# Patient Record
Sex: Female | Born: 1965 | Race: Black or African American | Hispanic: No | Marital: Single | Smoking: Never smoker
Health system: Southern US, Community
[De-identification: ages and names within clinical notes are randomized; demographics above are authoritative.]

## PROBLEM LIST (undated history)

## (undated) DIAGNOSIS — K219 Gastro-esophageal reflux disease without esophagitis: Secondary | ICD-10-CM

## (undated) DIAGNOSIS — IMO0001 Reserved for inherently not codable concepts without codable children: Secondary | ICD-10-CM

## (undated) DIAGNOSIS — M170 Bilateral primary osteoarthritis of knee: Secondary | ICD-10-CM

## (undated) DIAGNOSIS — J302 Other seasonal allergic rhinitis: Secondary | ICD-10-CM

## (undated) DIAGNOSIS — I10 Essential (primary) hypertension: Secondary | ICD-10-CM

## (undated) DIAGNOSIS — G8929 Other chronic pain: Secondary | ICD-10-CM

## (undated) DIAGNOSIS — G43909 Migraine, unspecified, not intractable, without status migrainosus: Secondary | ICD-10-CM

## (undated) DIAGNOSIS — M549 Dorsalgia, unspecified: Secondary | ICD-10-CM

## (undated) DIAGNOSIS — K8020 Calculus of gallbladder without cholecystitis without obstruction: Secondary | ICD-10-CM

## (undated) DIAGNOSIS — J45909 Unspecified asthma, uncomplicated: Secondary | ICD-10-CM

## (undated) HISTORY — DX: Morbid (severe) obesity due to excess calories: E66.01

## (undated) HISTORY — DX: Migraine, unspecified, not intractable, without status migrainosus: G43.909

## (undated) HISTORY — PX: BACK SURGERY: SHX140

## (undated) HISTORY — DX: Calculus of gallbladder without cholecystitis without obstruction: K80.20

## (undated) HISTORY — DX: Unspecified asthma, uncomplicated: J45.909

## (undated) HISTORY — DX: Other seasonal allergic rhinitis: J30.2

## (undated) HISTORY — PX: PARTIAL HYSTERECTOMY: SHX80

## (undated) HISTORY — DX: Other chronic pain: G89.29

## (undated) HISTORY — DX: Dorsalgia, unspecified: M54.9

## (undated) HISTORY — DX: Essential (primary) hypertension: I10

## (undated) HISTORY — DX: Reserved for inherently not codable concepts without codable children: IMO0001

## (undated) HISTORY — PX: TUBAL LIGATION: SHX77

## (undated) HISTORY — PX: OTHER SURGICAL HISTORY: SHX169

## (undated) HISTORY — PX: CHOLECYSTECTOMY: SHX55

## (undated) HISTORY — DX: Bilateral primary osteoarthritis of knee: M17.0

## (undated) HISTORY — DX: Gastro-esophageal reflux disease without esophagitis: K21.9

---

## 2001-05-31 ENCOUNTER — Ambulatory Visit (HOSPITAL_COMMUNITY): Admission: RE | Admit: 2001-05-31 | Discharge: 2001-05-31 | Payer: Self-pay | Admitting: Internal Medicine

## 2001-05-31 ENCOUNTER — Encounter: Payer: Self-pay | Admitting: Internal Medicine

## 2005-06-28 ENCOUNTER — Inpatient Hospital Stay (HOSPITAL_COMMUNITY): Admission: RE | Admit: 2005-06-28 | Discharge: 2005-07-01 | Payer: Self-pay | Admitting: Neurosurgery

## 2015-09-09 DIAGNOSIS — M79602 Pain in left arm: Secondary | ICD-10-CM | POA: Diagnosis not present

## 2015-09-20 DIAGNOSIS — G43009 Migraine without aura, not intractable, without status migrainosus: Secondary | ICD-10-CM | POA: Diagnosis not present

## 2015-09-20 DIAGNOSIS — M5126 Other intervertebral disc displacement, lumbar region: Secondary | ICD-10-CM | POA: Diagnosis not present

## 2015-09-20 DIAGNOSIS — M5416 Radiculopathy, lumbar region: Secondary | ICD-10-CM | POA: Diagnosis not present

## 2015-09-20 DIAGNOSIS — M5441 Lumbago with sciatica, right side: Secondary | ICD-10-CM | POA: Diagnosis not present

## 2015-09-20 DIAGNOSIS — M5442 Lumbago with sciatica, left side: Secondary | ICD-10-CM | POA: Diagnosis not present

## 2015-10-26 DIAGNOSIS — R1011 Right upper quadrant pain: Secondary | ICD-10-CM | POA: Diagnosis not present

## 2015-10-26 DIAGNOSIS — G43909 Migraine, unspecified, not intractable, without status migrainosus: Secondary | ICD-10-CM | POA: Diagnosis not present

## 2015-10-26 DIAGNOSIS — Z91013 Allergy to seafood: Secondary | ICD-10-CM | POA: Diagnosis not present

## 2015-10-26 DIAGNOSIS — R079 Chest pain, unspecified: Secondary | ICD-10-CM | POA: Diagnosis not present

## 2015-10-26 DIAGNOSIS — I161 Hypertensive emergency: Secondary | ICD-10-CM | POA: Diagnosis not present

## 2015-10-26 DIAGNOSIS — I7102 Dissection of abdominal aorta: Secondary | ICD-10-CM | POA: Diagnosis not present

## 2015-10-26 DIAGNOSIS — K920 Hematemesis: Secondary | ICD-10-CM | POA: Diagnosis not present

## 2015-10-26 DIAGNOSIS — E669 Obesity, unspecified: Secondary | ICD-10-CM | POA: Diagnosis not present

## 2015-10-26 DIAGNOSIS — I7100 Dissection of unspecified site of aorta: Secondary | ICD-10-CM | POA: Diagnosis not present

## 2015-10-26 DIAGNOSIS — I10 Essential (primary) hypertension: Secondary | ICD-10-CM | POA: Diagnosis not present

## 2015-10-26 DIAGNOSIS — F329 Major depressive disorder, single episode, unspecified: Secondary | ICD-10-CM | POA: Diagnosis not present

## 2015-10-26 DIAGNOSIS — R29898 Other symptoms and signs involving the musculoskeletal system: Secondary | ICD-10-CM | POA: Diagnosis not present

## 2015-10-26 DIAGNOSIS — Z6841 Body Mass Index (BMI) 40.0 and over, adult: Secondary | ICD-10-CM | POA: Diagnosis not present

## 2015-10-26 DIAGNOSIS — M549 Dorsalgia, unspecified: Secondary | ICD-10-CM | POA: Diagnosis not present

## 2015-10-26 DIAGNOSIS — Z880 Allergy status to penicillin: Secondary | ICD-10-CM | POA: Diagnosis not present

## 2015-10-26 DIAGNOSIS — F419 Anxiety disorder, unspecified: Secondary | ICD-10-CM | POA: Diagnosis not present

## 2015-10-26 DIAGNOSIS — Z881 Allergy status to other antibiotic agents status: Secondary | ICD-10-CM | POA: Diagnosis not present

## 2015-10-26 DIAGNOSIS — K8020 Calculus of gallbladder without cholecystitis without obstruction: Secondary | ICD-10-CM | POA: Diagnosis not present

## 2015-10-26 DIAGNOSIS — Z9114 Patient's other noncompliance with medication regimen: Secondary | ICD-10-CM | POA: Diagnosis not present

## 2015-10-26 DIAGNOSIS — R740 Nonspecific elevation of levels of transaminase and lactic acid dehydrogenase [LDH]: Secondary | ICD-10-CM | POA: Diagnosis not present

## 2015-10-26 DIAGNOSIS — R918 Other nonspecific abnormal finding of lung field: Secondary | ICD-10-CM | POA: Diagnosis not present

## 2015-10-26 DIAGNOSIS — I959 Hypotension, unspecified: Secondary | ICD-10-CM | POA: Diagnosis not present

## 2015-10-26 DIAGNOSIS — I7101 Dissection of thoracic aorta: Secondary | ICD-10-CM | POA: Diagnosis not present

## 2015-10-26 DIAGNOSIS — G4733 Obstructive sleep apnea (adult) (pediatric): Secondary | ICD-10-CM | POA: Diagnosis not present

## 2015-10-26 DIAGNOSIS — Z9884 Bariatric surgery status: Secondary | ICD-10-CM | POA: Diagnosis not present

## 2015-10-26 DIAGNOSIS — Z90710 Acquired absence of both cervix and uterus: Secondary | ICD-10-CM | POA: Diagnosis not present

## 2015-10-27 DIAGNOSIS — I959 Hypotension, unspecified: Secondary | ICD-10-CM | POA: Diagnosis not present

## 2015-10-27 DIAGNOSIS — R1011 Right upper quadrant pain: Secondary | ICD-10-CM | POA: Diagnosis not present

## 2015-10-27 DIAGNOSIS — R0789 Other chest pain: Secondary | ICD-10-CM | POA: Diagnosis not present

## 2015-10-27 DIAGNOSIS — I7101 Dissection of thoracic aorta: Secondary | ICD-10-CM | POA: Diagnosis not present

## 2015-10-27 DIAGNOSIS — K920 Hematemesis: Secondary | ICD-10-CM | POA: Diagnosis not present

## 2015-10-27 DIAGNOSIS — K8020 Calculus of gallbladder without cholecystitis without obstruction: Secondary | ICD-10-CM | POA: Diagnosis not present

## 2015-10-28 DIAGNOSIS — I7101 Dissection of thoracic aorta: Secondary | ICD-10-CM | POA: Diagnosis not present

## 2015-11-09 DIAGNOSIS — J45909 Unspecified asthma, uncomplicated: Secondary | ICD-10-CM | POA: Diagnosis not present

## 2015-11-09 DIAGNOSIS — Z1389 Encounter for screening for other disorder: Secondary | ICD-10-CM | POA: Diagnosis not present

## 2015-11-09 DIAGNOSIS — Z79899 Other long term (current) drug therapy: Secondary | ICD-10-CM | POA: Diagnosis not present

## 2015-11-09 DIAGNOSIS — G4700 Insomnia, unspecified: Secondary | ICD-10-CM | POA: Diagnosis not present

## 2015-12-06 DIAGNOSIS — G4733 Obstructive sleep apnea (adult) (pediatric): Secondary | ICD-10-CM | POA: Diagnosis not present

## 2015-12-10 DIAGNOSIS — N289 Disorder of kidney and ureter, unspecified: Secondary | ICD-10-CM | POA: Diagnosis not present

## 2015-12-10 DIAGNOSIS — G479 Sleep disorder, unspecified: Secondary | ICD-10-CM | POA: Diagnosis not present

## 2015-12-10 DIAGNOSIS — I10 Essential (primary) hypertension: Secondary | ICD-10-CM | POA: Diagnosis not present

## 2016-01-12 DIAGNOSIS — G43009 Migraine without aura, not intractable, without status migrainosus: Secondary | ICD-10-CM | POA: Diagnosis not present

## 2016-01-12 DIAGNOSIS — M5441 Lumbago with sciatica, right side: Secondary | ICD-10-CM | POA: Diagnosis not present

## 2016-01-12 DIAGNOSIS — M5126 Other intervertebral disc displacement, lumbar region: Secondary | ICD-10-CM | POA: Diagnosis not present

## 2016-01-12 DIAGNOSIS — M5416 Radiculopathy, lumbar region: Secondary | ICD-10-CM | POA: Diagnosis not present

## 2016-01-12 DIAGNOSIS — M5442 Lumbago with sciatica, left side: Secondary | ICD-10-CM | POA: Diagnosis not present

## 2016-01-30 DIAGNOSIS — R1011 Right upper quadrant pain: Secondary | ICD-10-CM | POA: Diagnosis not present

## 2016-01-30 DIAGNOSIS — R079 Chest pain, unspecified: Secondary | ICD-10-CM | POA: Diagnosis not present

## 2016-01-31 DIAGNOSIS — M5441 Lumbago with sciatica, right side: Secondary | ICD-10-CM | POA: Diagnosis not present

## 2016-01-31 DIAGNOSIS — M5442 Lumbago with sciatica, left side: Secondary | ICD-10-CM | POA: Diagnosis not present

## 2016-01-31 DIAGNOSIS — G8929 Other chronic pain: Secondary | ICD-10-CM | POA: Diagnosis not present

## 2016-02-03 DIAGNOSIS — Z8679 Personal history of other diseases of the circulatory system: Secondary | ICD-10-CM | POA: Diagnosis not present

## 2016-02-03 DIAGNOSIS — Z9884 Bariatric surgery status: Secondary | ICD-10-CM | POA: Diagnosis not present

## 2016-02-03 DIAGNOSIS — G479 Sleep disorder, unspecified: Secondary | ICD-10-CM | POA: Diagnosis not present

## 2016-02-03 DIAGNOSIS — K828 Other specified diseases of gallbladder: Secondary | ICD-10-CM | POA: Diagnosis not present

## 2016-02-07 DIAGNOSIS — K8080 Other cholelithiasis without obstruction: Secondary | ICD-10-CM | POA: Diagnosis not present

## 2016-02-07 DIAGNOSIS — K8020 Calculus of gallbladder without cholecystitis without obstruction: Secondary | ICD-10-CM | POA: Diagnosis not present

## 2016-02-09 DIAGNOSIS — K8020 Calculus of gallbladder without cholecystitis without obstruction: Secondary | ICD-10-CM | POA: Diagnosis not present

## 2016-02-09 DIAGNOSIS — R945 Abnormal results of liver function studies: Secondary | ICD-10-CM | POA: Diagnosis not present

## 2016-02-15 ENCOUNTER — Institutional Professional Consult (permissible substitution) (INDEPENDENT_AMBULATORY_CARE_PROVIDER_SITE_OTHER): Payer: Medicare Other | Admitting: Thoracic Surgery (Cardiothoracic Vascular Surgery)

## 2016-02-15 ENCOUNTER — Encounter: Payer: Self-pay | Admitting: Thoracic Surgery (Cardiothoracic Vascular Surgery)

## 2016-02-15 ENCOUNTER — Encounter: Payer: Self-pay | Admitting: *Deleted

## 2016-02-15 VITALS — BP 158/90 | HR 70 | Resp 16 | Ht 62.0 in | Wt 265.0 lb

## 2016-02-15 DIAGNOSIS — Z8679 Personal history of other diseases of the circulatory system: Secondary | ICD-10-CM

## 2016-02-15 NOTE — Progress Notes (Signed)
PCP is REDDING Valrie Hart., MD Referring Provider is Street, Stephanie Coup, *  Chief Complaint  Patient presents with  . Referral    to R/O AORTIC DISSECTION....PREPARING FOR CHOLECYSTECTOMY    HPI: [name] is sent for consultation regarding a possible aortic dissection.  She is a 50 year old woman with a history of morbid obesity status post bariatric surgery, asthma, hypertension, and chronic back pain. She was seen in the emergency room at [facility] back in March of this year for chest pain. I do not have access to those CT scan images, but the official report states "there is a thin subtle flap in the lumen of the aorta from the aortic root extending 13 mm into the ascending aorta. Anterior descending, circumflex, and right coronary arteries appear to enhance area no further abnormalities in the aorta or the origins of the great vessels of the neck." She was transferred to the [facility] but was told that she probably did not have an aortic dissection.  She recently was in the emergency department again with chest pain. This was right upper quadrant and epigastric in nature. A CT angio of the chest, abdomen and pelvis was done. It showed "mild cardiomegaly. No definite dissection of the thoracic aorta. No evidence of aneurysm. Normal 3 vessel takeoff from the aortic arch. Pulmonary arterial system is within normal. Minimal low density adjacent to the aortic arch unchanged." Her pain was felt to be due to biliary colic and she was told she needed her gallbladder out. She has not had further pain since that visit.   Past Medical History  Diagnosis Date  . Chronic back pain     f/b [facility]  . Asthma     f/b Dr. Lucie Leather  . Morbid obesity (HCC)   . Migraine   . Reflux   . Cholelithiasis without cholecystitis   . Seasonal allergies     Past Surgical History  Procedure Laterality Date  . Tubal ligation    . Back surgery      low disc   . Gastrectomy sleeve       bariatric    Family History  Problem Relation Age of Onset  . Diabetes Mother   . Heart disease Father   . Cancer Sister     bone    Social History Social History  Substance Use Topics  . Smoking status: Never Smoker   . Smokeless tobacco: None  . Alcohol Use: No    Current Outpatient Prescriptions  Medication Sig Dispense Refill  . metoprolol succinate (TOPROL-XL) 50 MG 24 hr tablet Take 50 mg by mouth daily. Take with or immediately following a meal.    . albuterol (PROVENTIL HFA;VENTOLIN HFA) 108 (90 Base) MCG/ACT inhaler Inhale 2 puffs into the lungs 3 (three) times daily as needed for wheezing or shortness of breath.    . ALPRAZolam (XANAX) 0.25 MG tablet Take 0.25-0.5 mg by mouth at bedtime as needed for anxiety.    Marland Kitchen amLODipine (NORVASC) 5 MG tablet Take 5 mg by mouth daily.    Marland Kitchen gabapentin (NEURONTIN) 600 MG tablet Take 600 mg by mouth 3 (three) times daily.    . hydrochlorothiazide (HYDRODIURIL) 25 MG tablet Take 25 mg by mouth daily.    Marland Kitchen oxyCODONE (ROXICODONE) 15 MG immediate release tablet Take 15 mg by mouth every 4 (four) hours as needed for pain (every 4-6 hrs prn).    . traMADol (ULTRAM) 50 MG tablet Take by mouth every 6 (six) hours as  needed.    . venlafaxine XR (EFFEXOR-XR) 150 MG 24 hr capsule Take 150 mg by mouth daily with breakfast.     No current facility-administered medications for this visit.    Allergies  Allergen Reactions  . Penicillins Other (See Comments)    Severe reaction   . Contrast Media [Iodinated Diagnostic Agents]     Includes shellfish   . Vigamox [Moxifloxacin] Swelling    Some eye swelling      Review of Systems  Constitutional: Negative for fever, chills, appetite change and unexpected weight change.  HENT: Positive for dental problem. Negative for trouble swallowing and voice change.   Eyes: Negative for visual disturbance.  Respiratory: Positive for cough and shortness of breath. Negative for wheezing.    Cardiovascular: Positive for chest pain and leg swelling.  Gastrointestinal: Positive for abdominal pain and constipation.  Genitourinary: Negative for dysuria and difficulty urinating.  Musculoskeletal: Positive for myalgias and arthralgias.       Pain and feet with lying flat. Poor circulation in legs.  Neurological: Positive for numbness and headaches.  Hematological: Negative for adenopathy. Bruises/bleeds easily.  All other systems reviewed and are negative.   BP 158/90 mmHg  Pulse 70  Resp 16  Ht 5\' 2"  (1.575 m)  Wt 265 lb (120.203 kg)  BMI 48.46 kg/m2  SpO2 98% Physical Exam  Constitutional: She is oriented to person, place, and time.  Morbidly obese  HENT:  Head: Normocephalic and atraumatic.  Mouth/Throat: No oropharyngeal exudate.  Eyes: Conjunctivae and EOM are normal. No scleral icterus.  Neck: Neck supple. No thyromegaly present.  No carotid bruits  Cardiovascular: Normal rate, regular rhythm and normal heart sounds.  Exam reveals no gallop and no friction rub.   No murmur heard. Pulmonary/Chest: Effort normal. No respiratory distress. She has no wheezes. She has no rales.  Diminished breath sounds bilaterally  Abdominal: Soft. She exhibits no distension. There is no tenderness.  Musculoskeletal: She exhibits no edema.  Lymphadenopathy:    She has no cervical adenopathy.  Neurological: She is alert and oriented to person, place, and time. No cranial nerve deficit.  No focal motor deficit  Skin: Skin is warm and dry.  Vitals reviewed.    Diagnostic Tests: I personally reviewed the CT chest from 01/30/2016. There is no clear evidence of aortic dissection. There is likely motion artifact. The area is not well visualized and the possibility of a very small limited dissection cannot be ruled out.  Impression: 50 year old woman who is sent for consultation regarding a possible ascending aortic dissection. She had presented to the emergency room back in March with  chest pain. A CT angiogram showed a possible very limited aortic dissection in the root. She was transferred to [facility]. I do not have records from that visit, but she says she was told that she did not have a dissection and did not need surgery.  She recently was back in the emergency room with chest pain once again. It turns out this is likely due to biliary colic and she needs her gallbladder removed. Another CT was done. The official radiology reading was that there was no definite evidence of an aortic dissection.  I reviewed her CT images from 01/30/2016. I cannot definitively rule out the possibility of there is a very limited dissection but certainly there is no major dissection. There is likely motion artifact. I do not have the films from March, but is possible that motion artifact with a count for the  findings on that CT angiogram in that particular area, but I cannot give a definite opinion on that without reviewing the films.  In any event, there is no evidence of an acute aortic dissection. There is no indication for surgical intervention. I don't think there is any indication for additional workup at this time. There is no contraindication to proceeding with gallbladder surgery from the standpoint of her ascending aorta.  Her blood pressure was elevated today. That is understandable in the setting of seeing a surgeon for the first time. Her blood pressure was 130/80 at her most recent visit with primary care. That is likely more indicative of her normal blood pressure. Blood pressure control is important for her.  Plan:  I would like to see her back in 6 months with a repeat CT angiogram of the chest to look at the ascending aorta one more time to see if there is any evidence of aortic dissection. If there is truly a chronic dissection she would need continued follow-up for possible late aneurysmal dilatation.  Loreli SlotSteven C Shanaia Sievers, MD [facility] [phone]  [phone]

## 2016-02-23 DIAGNOSIS — K644 Residual hemorrhoidal skin tags: Secondary | ICD-10-CM | POA: Diagnosis not present

## 2016-02-29 DIAGNOSIS — R079 Chest pain, unspecified: Secondary | ICD-10-CM | POA: Diagnosis not present

## 2016-02-29 DIAGNOSIS — G8929 Other chronic pain: Secondary | ICD-10-CM | POA: Diagnosis not present

## 2016-02-29 DIAGNOSIS — M1990 Unspecified osteoarthritis, unspecified site: Secondary | ICD-10-CM | POA: Diagnosis not present

## 2016-02-29 DIAGNOSIS — K9171 Accidental puncture and laceration of a digestive system organ or structure during a digestive system procedure: Secondary | ICD-10-CM | POA: Diagnosis not present

## 2016-02-29 DIAGNOSIS — K8080 Other cholelithiasis without obstruction: Secondary | ICD-10-CM | POA: Diagnosis not present

## 2016-02-29 DIAGNOSIS — K660 Peritoneal adhesions (postprocedural) (postinfection): Secondary | ICD-10-CM | POA: Diagnosis not present

## 2016-02-29 DIAGNOSIS — Z9884 Bariatric surgery status: Secondary | ICD-10-CM | POA: Diagnosis not present

## 2016-02-29 DIAGNOSIS — S36439A Laceration of unspecified part of small intestine, initial encounter: Secondary | ICD-10-CM | POA: Diagnosis not present

## 2016-02-29 DIAGNOSIS — R0789 Other chest pain: Secondary | ICD-10-CM | POA: Diagnosis not present

## 2016-02-29 DIAGNOSIS — Z8249 Family history of ischemic heart disease and other diseases of the circulatory system: Secondary | ICD-10-CM | POA: Diagnosis not present

## 2016-02-29 DIAGNOSIS — Z9049 Acquired absence of other specified parts of digestive tract: Secondary | ICD-10-CM | POA: Diagnosis not present

## 2016-02-29 DIAGNOSIS — Z9889 Other specified postprocedural states: Secondary | ICD-10-CM | POA: Diagnosis not present

## 2016-02-29 DIAGNOSIS — R7989 Other specified abnormal findings of blood chemistry: Secondary | ICD-10-CM | POA: Diagnosis not present

## 2016-02-29 DIAGNOSIS — R932 Abnormal findings on diagnostic imaging of liver and biliary tract: Secondary | ICD-10-CM | POA: Diagnosis not present

## 2016-02-29 DIAGNOSIS — I10 Essential (primary) hypertension: Secondary | ICD-10-CM | POA: Diagnosis not present

## 2016-02-29 DIAGNOSIS — K8020 Calculus of gallbladder without cholecystitis without obstruction: Secondary | ICD-10-CM | POA: Diagnosis not present

## 2016-02-29 DIAGNOSIS — K824 Cholesterolosis of gallbladder: Secondary | ICD-10-CM | POA: Diagnosis not present

## 2016-02-29 DIAGNOSIS — K449 Diaphragmatic hernia without obstruction or gangrene: Secondary | ICD-10-CM | POA: Diagnosis not present

## 2016-02-29 DIAGNOSIS — R188 Other ascites: Secondary | ICD-10-CM | POA: Diagnosis not present

## 2016-02-29 DIAGNOSIS — Z880 Allergy status to penicillin: Secondary | ICD-10-CM | POA: Diagnosis not present

## 2016-02-29 DIAGNOSIS — M549 Dorsalgia, unspecified: Secondary | ICD-10-CM | POA: Diagnosis not present

## 2016-02-29 DIAGNOSIS — Z79891 Long term (current) use of opiate analgesic: Secondary | ICD-10-CM | POA: Diagnosis not present

## 2016-02-29 DIAGNOSIS — K219 Gastro-esophageal reflux disease without esophagitis: Secondary | ICD-10-CM | POA: Diagnosis not present

## 2016-02-29 DIAGNOSIS — R918 Other nonspecific abnormal finding of lung field: Secondary | ICD-10-CM | POA: Diagnosis not present

## 2016-03-30 DIAGNOSIS — G4700 Insomnia, unspecified: Secondary | ICD-10-CM | POA: Diagnosis not present

## 2016-03-30 DIAGNOSIS — M722 Plantar fascial fibromatosis: Secondary | ICD-10-CM | POA: Diagnosis not present

## 2016-04-05 DIAGNOSIS — R200 Anesthesia of skin: Secondary | ICD-10-CM | POA: Diagnosis not present

## 2016-04-05 DIAGNOSIS — M5416 Radiculopathy, lumbar region: Secondary | ICD-10-CM | POA: Diagnosis not present

## 2016-04-05 DIAGNOSIS — G43009 Migraine without aura, not intractable, without status migrainosus: Secondary | ICD-10-CM | POA: Diagnosis not present

## 2016-04-05 DIAGNOSIS — M5126 Other intervertebral disc displacement, lumbar region: Secondary | ICD-10-CM | POA: Diagnosis not present

## 2016-04-05 DIAGNOSIS — M5441 Lumbago with sciatica, right side: Secondary | ICD-10-CM | POA: Diagnosis not present

## 2016-04-11 DIAGNOSIS — M722 Plantar fascial fibromatosis: Secondary | ICD-10-CM | POA: Diagnosis not present

## 2016-04-11 DIAGNOSIS — M6702 Short Achilles tendon (acquired), left ankle: Secondary | ICD-10-CM | POA: Diagnosis not present

## 2016-05-15 DIAGNOSIS — Z1231 Encounter for screening mammogram for malignant neoplasm of breast: Secondary | ICD-10-CM | POA: Diagnosis not present

## 2016-06-21 DIAGNOSIS — Z23 Encounter for immunization: Secondary | ICD-10-CM | POA: Diagnosis not present

## 2016-06-21 DIAGNOSIS — G4700 Insomnia, unspecified: Secondary | ICD-10-CM | POA: Diagnosis not present

## 2016-06-21 DIAGNOSIS — I10 Essential (primary) hypertension: Secondary | ICD-10-CM | POA: Diagnosis not present

## 2016-07-05 DIAGNOSIS — M5416 Radiculopathy, lumbar region: Secondary | ICD-10-CM | POA: Diagnosis not present

## 2016-07-05 DIAGNOSIS — G8929 Other chronic pain: Secondary | ICD-10-CM | POA: Diagnosis not present

## 2016-07-05 DIAGNOSIS — G43009 Migraine without aura, not intractable, without status migrainosus: Secondary | ICD-10-CM | POA: Diagnosis not present

## 2016-07-05 DIAGNOSIS — M5441 Lumbago with sciatica, right side: Secondary | ICD-10-CM | POA: Diagnosis not present

## 2016-07-05 DIAGNOSIS — M5126 Other intervertebral disc displacement, lumbar region: Secondary | ICD-10-CM | POA: Diagnosis not present

## 2016-07-13 DIAGNOSIS — J00 Acute nasopharyngitis [common cold]: Secondary | ICD-10-CM | POA: Diagnosis not present

## 2016-07-27 ENCOUNTER — Other Ambulatory Visit: Payer: Self-pay | Admitting: Thoracic Surgery (Cardiothoracic Vascular Surgery)

## 2016-07-27 DIAGNOSIS — I7121 Aneurysm of the ascending aorta, without rupture: Secondary | ICD-10-CM

## 2016-07-27 DIAGNOSIS — I712 Thoracic aortic aneurysm, without rupture: Secondary | ICD-10-CM

## 2016-08-21 ENCOUNTER — Other Ambulatory Visit: Payer: Self-pay | Admitting: Thoracic Surgery (Cardiothoracic Vascular Surgery)

## 2016-08-22 ENCOUNTER — Ambulatory Visit: Payer: Medicare Other | Admitting: Thoracic Surgery (Cardiothoracic Vascular Surgery)

## 2016-08-22 ENCOUNTER — Other Ambulatory Visit: Payer: Medicare Other

## 2016-08-25 ENCOUNTER — Inpatient Hospital Stay: Admission: RE | Admit: 2016-08-25 | Payer: Medicare Other | Source: Ambulatory Visit

## 2016-08-25 DIAGNOSIS — J45901 Unspecified asthma with (acute) exacerbation: Secondary | ICD-10-CM | POA: Diagnosis not present

## 2016-08-25 DIAGNOSIS — J209 Acute bronchitis, unspecified: Secondary | ICD-10-CM | POA: Diagnosis not present

## 2016-09-05 ENCOUNTER — Ambulatory Visit: Payer: Medicare Other | Admitting: Thoracic Surgery (Cardiothoracic Vascular Surgery)

## 2016-09-19 ENCOUNTER — Encounter: Payer: Self-pay | Admitting: Thoracic Surgery (Cardiothoracic Vascular Surgery)

## 2016-09-19 ENCOUNTER — Ambulatory Visit (INDEPENDENT_AMBULATORY_CARE_PROVIDER_SITE_OTHER): Payer: Medicare Other | Admitting: Thoracic Surgery (Cardiothoracic Vascular Surgery)

## 2016-09-19 ENCOUNTER — Ambulatory Visit
Admission: RE | Admit: 2016-09-19 | Discharge: 2016-09-19 | Disposition: A | Discharge status: Home / Self Care | Payer: Medicare Other | Source: Ambulatory Visit | Attending: Thoracic Surgery (Cardiothoracic Vascular Surgery) | Admitting: Thoracic Surgery (Cardiothoracic Vascular Surgery)

## 2016-09-19 VITALS — BP 177/97 | HR 84 | Resp 20 | Ht 62.0 in | Wt 265.0 lb

## 2016-09-19 DIAGNOSIS — I10 Essential (primary) hypertension: Secondary | ICD-10-CM | POA: Diagnosis not present

## 2016-09-19 DIAGNOSIS — I712 Thoracic aortic aneurysm, without rupture: Secondary | ICD-10-CM

## 2016-09-19 DIAGNOSIS — Z8679 Personal history of other diseases of the circulatory system: Secondary | ICD-10-CM | POA: Diagnosis not present

## 2016-09-19 DIAGNOSIS — I7121 Aneurysm of the ascending aorta, without rupture: Secondary | ICD-10-CM

## 2016-09-19 MED ORDER — IOPAMIDOL (ISOVUE-370) INJECTION 76%
75.0000 mL | Freq: Once | INTRAVENOUS | Status: AC | PRN
  Administered 2016-09-19: 75 mL via INTRAVENOUS

## 2016-09-19 NOTE — Progress Notes (Signed)
[address]       [place] [id number]             [phone]    HPI: [name] returns today for a follow-up of her ascending aorta.  [name] is a 51 year old woman with a history of hypertension and morbid obesity. In March 2017 she was seen in [facility] room [facility] for chest pain. She had a CT done which showed a question of an aortic dissection in the ascending aorta. She was sent to [facility]. The doctors there told her she did not have an aortic dissection and did not need surgery. In July she had another episode of epigastric and right upper quadrant pain. A CT angios the chest abdomen and pelvis showed no definitive evidence of aortic dissection. She was found to have cholelithiasis and had a cholecystectomy. She has not had any further epigastric or chest pain since then.  Today she complains of some numbness and tingling in her right arm.   Current Outpatient Prescriptions  Medication Sig Dispense Refill  . albuterol (PROVENTIL HFA;VENTOLIN HFA) 108 (90 Base) MCG/ACT inhaler Inhale 2 puffs into the lungs 3 (three) times daily as needed for wheezing or shortness of breath.    . ALPRAZolam (XANAX) 0.25 MG tablet Take 0.25-0.5 mg by mouth at bedtime as needed for anxiety.    Marland Kitchen. amLODipine (NORVASC) 5 MG tablet Take 5 mg by mouth daily.    Marland Kitchen. gabapentin (NEURONTIN) 600 MG tablet Take 600 mg by mouth 3 (three) times daily.    . hydrochlorothiazide (HYDRODIURIL) 25 MG tablet Take 25 mg by mouth daily.    . metoprolol succinate (TOPROL-XL) 50 MG 24 hr tablet Take 50 mg by mouth daily. Take with or immediately following a meal.    . oxyCODONE (ROXICODONE) 15 MG immediate release tablet Take 15 mg by mouth every 4 (four) hours as needed for pain (every 4-6 hrs prn).    . traMADol (ULTRAM) 50 MG tablet Take by mouth every 6 (six) hours as needed.    . venlafaxine XR (EFFEXOR-XR) 150 MG 24 hr capsule Take 150 mg by mouth daily with breakfast.      No current facility-administered medications for this visit.     Physical Exam BP (!) 177/97   Pulse 84   Resp 20   Ht 5\' 2"  (1.575 m)   Wt 265 lb (120.2 kg)   SpO2 98% Comment: RA  BMI 48.47 kg/m  Morbidly obese 51 year old woman in no acute distress Alert and oriented 3 with no focal deficits Cardiac regular rate and rhythm no murmur Lungs clear with equal results bilaterally Pulses 2+ and symmetric bilaterally. Right hand well perfused with brisk capillary refill. Motor intact  Diagnostic Tests: CT ANGIOGRAPHY CHEST WITH CONTRAST  TECHNIQUE: Multidetector CT imaging of the chest was performed using the standard protocol during bolus administration of intravenous contrast. Multiplanar CT image reconstructions and MIPs were obtained to evaluate the vascular anatomy. Procedure was performed with cardiac gating.  CONTRAST:  75 mL Isovue 370  COMPARISON:  None.  FINDINGS: Cardiovascular: Aortic root measures 3.0 cm. Maximum diameter of the ascending thoracic aorta measures up to 3.6 cm and that may be a slight overestimation. Aortic arch roughly measures 3 cm in diameter. Proximal descending thoracic aorta measures 2.6 cm. Distal descending thoracic aorta measures 2.3 cm. Normal caliber of the proximal abdominal aorta. Celiac trunk and SMA are patent. Main renal arteries are patent.  Main pulmonary arteries are patent.  Mediastinum/Nodes: No gross abnormality to thyroid tissue. No significant mediastinal or hilar lymphadenopathy. No pericardial effusion.  Lungs/Pleura: Trachea and mainstem bronchi are patent. Two small nodules in the right middle lobe both of which measure 3 mm. No significant airspace disease or lung consolidation. No pleural effusions. Punctate pleural-based nodule in left upper lobe on sequence 5 image 43. Question a 4 mm nodule at the left lung apex on sequence 5, image 20. Question a 3 mm nodule in left upper lobe on image 34.  Upper  Abdomen: Patient has had gastric surgery. Question parapelvic renal cysts but poorly characterized due to the timing of the contrast administration and patient's large body habitus. No acute abnormality in the upper abdomen.  Musculoskeletal: No acute bone abnormality.  Review of the MIP images confirms the above findings.  IMPRESSION: No evidence for a thoracic aortic aneurysm or dissection.  Small punctate pulmonary nodules, largest measuring 4 mm. These are nonspecific. No follow-up needed if patient is low-risk. Non-contrast chest CT can be considered in 12 months if patient is high-risk. This recommendation follows the consensus statement: Guidelines for Management of Incidental Pulmonary Nodules Detected on CT Images: From the Fleischner Society 2017; Radiology 2017; 284:228-243.  Prior gastric surgery.  Low-density in the renal sinus regions. Findings could represent parapelvic renal cysts but incompletely evaluated on this examination. Hydronephrosis is thought to be less likely.   Electronically Signed   By: Richarda Overlie M.D.   On: 09/19/2016 16:04  I personally reviewed the CT angiogram. Agree that there is no evidence of aortic aneurysm or dissection.  Impression: 51 year old woman who at one time was told she had an aortic dissection. She now has had 2 follow-up CT angiograms with no evidence of aortic aneurysm or dissection. No further follow-up is necessary.  Hypertension- poorly controlled she should follow-up with Dr. Jeanie Sewer regarding that issue.  Small punctate pulmonary nodules measuring 4 mm or less. Nonsmoker so low risk. No need for further follow-up.  Plan: I will be happy to see [name] again at any time in the future if I can be of any further assistance with her care.  Loreli Slot, MD [facility] [phone]

## 2016-11-03 DIAGNOSIS — M5441 Lumbago with sciatica, right side: Secondary | ICD-10-CM | POA: Diagnosis not present

## 2016-11-03 DIAGNOSIS — M5416 Radiculopathy, lumbar region: Secondary | ICD-10-CM | POA: Diagnosis not present

## 2016-11-03 DIAGNOSIS — G8929 Other chronic pain: Secondary | ICD-10-CM | POA: Diagnosis not present

## 2016-11-03 DIAGNOSIS — M5126 Other intervertebral disc displacement, lumbar region: Secondary | ICD-10-CM | POA: Diagnosis not present

## 2016-11-09 DIAGNOSIS — J0100 Acute maxillary sinusitis, unspecified: Secondary | ICD-10-CM | POA: Diagnosis not present

## 2016-11-21 DIAGNOSIS — M5126 Other intervertebral disc displacement, lumbar region: Secondary | ICD-10-CM | POA: Diagnosis not present

## 2016-12-19 DIAGNOSIS — Z0000 Encounter for general adult medical examination without abnormal findings: Secondary | ICD-10-CM | POA: Diagnosis not present

## 2016-12-19 DIAGNOSIS — I10 Essential (primary) hypertension: Secondary | ICD-10-CM | POA: Diagnosis not present

## 2016-12-19 DIAGNOSIS — M5432 Sciatica, left side: Secondary | ICD-10-CM | POA: Diagnosis not present

## 2017-01-01 DIAGNOSIS — M5416 Radiculopathy, lumbar region: Secondary | ICD-10-CM | POA: Diagnosis not present

## 2017-01-01 DIAGNOSIS — G8929 Other chronic pain: Secondary | ICD-10-CM | POA: Diagnosis not present

## 2017-01-01 DIAGNOSIS — M5441 Lumbago with sciatica, right side: Secondary | ICD-10-CM | POA: Diagnosis not present

## 2017-01-01 DIAGNOSIS — G43009 Migraine without aura, not intractable, without status migrainosus: Secondary | ICD-10-CM | POA: Diagnosis not present

## 2017-01-01 DIAGNOSIS — M5126 Other intervertebral disc displacement, lumbar region: Secondary | ICD-10-CM | POA: Diagnosis not present

## 2017-02-21 DIAGNOSIS — M25562 Pain in left knee: Secondary | ICD-10-CM | POA: Diagnosis not present

## 2017-02-21 DIAGNOSIS — M545 Low back pain: Secondary | ICD-10-CM | POA: Diagnosis not present

## 2017-02-21 DIAGNOSIS — M25552 Pain in left hip: Secondary | ICD-10-CM | POA: Diagnosis not present

## 2017-02-27 DIAGNOSIS — M5416 Radiculopathy, lumbar region: Secondary | ICD-10-CM | POA: Diagnosis not present

## 2017-02-27 DIAGNOSIS — M5441 Lumbago with sciatica, right side: Secondary | ICD-10-CM | POA: Diagnosis not present

## 2017-02-27 DIAGNOSIS — G8929 Other chronic pain: Secondary | ICD-10-CM | POA: Diagnosis not present

## 2017-03-06 DIAGNOSIS — M1712 Unilateral primary osteoarthritis, left knee: Secondary | ICD-10-CM | POA: Diagnosis not present

## 2017-03-13 DIAGNOSIS — N390 Urinary tract infection, site not specified: Secondary | ICD-10-CM | POA: Diagnosis not present

## 2017-04-19 DIAGNOSIS — I10 Essential (primary) hypertension: Secondary | ICD-10-CM | POA: Diagnosis not present

## 2017-04-19 DIAGNOSIS — G8929 Other chronic pain: Secondary | ICD-10-CM | POA: Diagnosis not present

## 2017-04-19 DIAGNOSIS — Z5181 Encounter for therapeutic drug level monitoring: Secondary | ICD-10-CM | POA: Diagnosis not present

## 2017-04-19 DIAGNOSIS — M5442 Lumbago with sciatica, left side: Secondary | ICD-10-CM | POA: Diagnosis not present

## 2017-05-16 DIAGNOSIS — G43909 Migraine, unspecified, not intractable, without status migrainosus: Secondary | ICD-10-CM | POA: Diagnosis not present

## 2017-05-16 DIAGNOSIS — I160 Hypertensive urgency: Secondary | ICD-10-CM | POA: Diagnosis not present

## 2017-05-21 DIAGNOSIS — M533 Sacrococcygeal disorders, not elsewhere classified: Secondary | ICD-10-CM | POA: Diagnosis not present

## 2017-05-21 DIAGNOSIS — M7918 Myalgia, other site: Secondary | ICD-10-CM | POA: Diagnosis not present

## 2017-05-21 DIAGNOSIS — M5417 Radiculopathy, lumbosacral region: Secondary | ICD-10-CM | POA: Diagnosis not present

## 2017-05-21 DIAGNOSIS — I10 Essential (primary) hypertension: Secondary | ICD-10-CM | POA: Diagnosis not present

## 2017-06-06 DIAGNOSIS — I10 Essential (primary) hypertension: Secondary | ICD-10-CM | POA: Diagnosis not present

## 2017-06-06 DIAGNOSIS — K219 Gastro-esophageal reflux disease without esophagitis: Secondary | ICD-10-CM | POA: Diagnosis not present

## 2017-06-06 DIAGNOSIS — M5417 Radiculopathy, lumbosacral region: Secondary | ICD-10-CM | POA: Diagnosis not present

## 2017-06-06 DIAGNOSIS — Z91013 Allergy to seafood: Secondary | ICD-10-CM | POA: Diagnosis not present

## 2017-06-06 DIAGNOSIS — Z9884 Bariatric surgery status: Secondary | ICD-10-CM | POA: Diagnosis not present

## 2017-06-06 DIAGNOSIS — Z9049 Acquired absence of other specified parts of digestive tract: Secondary | ICD-10-CM | POA: Diagnosis not present

## 2017-06-06 DIAGNOSIS — G8929 Other chronic pain: Secondary | ICD-10-CM | POA: Diagnosis not present

## 2017-06-06 DIAGNOSIS — Z880 Allergy status to penicillin: Secondary | ICD-10-CM | POA: Diagnosis not present

## 2017-06-06 DIAGNOSIS — Z888 Allergy status to other drugs, medicaments and biological substances status: Secondary | ICD-10-CM | POA: Diagnosis not present

## 2017-06-20 DIAGNOSIS — Z880 Allergy status to penicillin: Secondary | ICD-10-CM | POA: Diagnosis not present

## 2017-06-20 DIAGNOSIS — M1288 Other specific arthropathies, not elsewhere classified, other specified site: Secondary | ICD-10-CM | POA: Diagnosis not present

## 2017-06-20 DIAGNOSIS — M533 Sacrococcygeal disorders, not elsewhere classified: Secondary | ICD-10-CM | POA: Diagnosis not present

## 2017-06-20 DIAGNOSIS — I10 Essential (primary) hypertension: Secondary | ICD-10-CM | POA: Diagnosis not present

## 2017-06-20 DIAGNOSIS — Z881 Allergy status to other antibiotic agents status: Secondary | ICD-10-CM | POA: Diagnosis not present

## 2017-06-20 DIAGNOSIS — M5417 Radiculopathy, lumbosacral region: Secondary | ICD-10-CM | POA: Diagnosis not present

## 2017-06-20 DIAGNOSIS — G894 Chronic pain syndrome: Secondary | ICD-10-CM | POA: Diagnosis not present

## 2017-06-20 DIAGNOSIS — Z91013 Allergy to seafood: Secondary | ICD-10-CM | POA: Diagnosis not present

## 2017-07-16 DIAGNOSIS — H5202 Hypermetropia, left eye: Secondary | ICD-10-CM | POA: Diagnosis not present

## 2017-07-16 DIAGNOSIS — H52223 Regular astigmatism, bilateral: Secondary | ICD-10-CM | POA: Diagnosis not present

## 2017-07-16 DIAGNOSIS — H401134 Primary open-angle glaucoma, bilateral, indeterminate stage: Secondary | ICD-10-CM | POA: Diagnosis not present

## 2017-07-17 DIAGNOSIS — M5417 Radiculopathy, lumbosacral region: Secondary | ICD-10-CM | POA: Diagnosis not present

## 2017-07-17 DIAGNOSIS — M533 Sacrococcygeal disorders, not elsewhere classified: Secondary | ICD-10-CM | POA: Diagnosis not present

## 2017-07-17 DIAGNOSIS — I10 Essential (primary) hypertension: Secondary | ICD-10-CM | POA: Diagnosis not present

## 2017-07-17 DIAGNOSIS — G894 Chronic pain syndrome: Secondary | ICD-10-CM | POA: Diagnosis not present

## 2017-07-29 DIAGNOSIS — J0100 Acute maxillary sinusitis, unspecified: Secondary | ICD-10-CM | POA: Diagnosis not present

## 2017-08-14 DIAGNOSIS — M533 Sacrococcygeal disorders, not elsewhere classified: Secondary | ICD-10-CM | POA: Diagnosis not present

## 2017-08-14 DIAGNOSIS — G894 Chronic pain syndrome: Secondary | ICD-10-CM | POA: Diagnosis not present

## 2017-08-14 DIAGNOSIS — M5417 Radiculopathy, lumbosacral region: Secondary | ICD-10-CM | POA: Diagnosis not present

## 2017-08-14 DIAGNOSIS — I10 Essential (primary) hypertension: Secondary | ICD-10-CM | POA: Diagnosis not present

## 2017-08-15 DIAGNOSIS — M21371 Foot drop, right foot: Secondary | ICD-10-CM | POA: Diagnosis not present

## 2017-08-15 DIAGNOSIS — M5416 Radiculopathy, lumbar region: Secondary | ICD-10-CM | POA: Diagnosis not present

## 2017-08-15 DIAGNOSIS — M5136 Other intervertebral disc degeneration, lumbar region: Secondary | ICD-10-CM | POA: Diagnosis not present

## 2017-08-15 DIAGNOSIS — M47816 Spondylosis without myelopathy or radiculopathy, lumbar region: Secondary | ICD-10-CM | POA: Diagnosis not present

## 2017-08-15 DIAGNOSIS — M5126 Other intervertebral disc displacement, lumbar region: Secondary | ICD-10-CM | POA: Diagnosis not present

## 2017-09-06 DIAGNOSIS — M5116 Intervertebral disc disorders with radiculopathy, lumbar region: Secondary | ICD-10-CM | POA: Diagnosis not present

## 2017-09-06 DIAGNOSIS — M4726 Other spondylosis with radiculopathy, lumbar region: Secondary | ICD-10-CM | POA: Diagnosis not present

## 2017-09-06 DIAGNOSIS — M4727 Other spondylosis with radiculopathy, lumbosacral region: Secondary | ICD-10-CM | POA: Diagnosis not present

## 2017-09-21 DIAGNOSIS — G894 Chronic pain syndrome: Secondary | ICD-10-CM | POA: Diagnosis not present

## 2017-09-21 DIAGNOSIS — M533 Sacrococcygeal disorders, not elsewhere classified: Secondary | ICD-10-CM | POA: Diagnosis not present

## 2017-09-21 DIAGNOSIS — M5417 Radiculopathy, lumbosacral region: Secondary | ICD-10-CM | POA: Diagnosis not present

## 2017-09-21 DIAGNOSIS — I10 Essential (primary) hypertension: Secondary | ICD-10-CM | POA: Diagnosis not present

## 2017-10-02 DIAGNOSIS — M549 Dorsalgia, unspecified: Secondary | ICD-10-CM | POA: Diagnosis not present

## 2017-10-02 DIAGNOSIS — M21371 Foot drop, right foot: Secondary | ICD-10-CM | POA: Diagnosis not present

## 2017-10-02 DIAGNOSIS — M21372 Foot drop, left foot: Secondary | ICD-10-CM | POA: Diagnosis not present

## 2017-10-02 DIAGNOSIS — G8929 Other chronic pain: Secondary | ICD-10-CM | POA: Diagnosis not present

## 2017-10-15 DIAGNOSIS — M5136 Other intervertebral disc degeneration, lumbar region: Secondary | ICD-10-CM | POA: Diagnosis not present

## 2017-10-15 DIAGNOSIS — M5416 Radiculopathy, lumbar region: Secondary | ICD-10-CM | POA: Diagnosis not present

## 2017-10-15 DIAGNOSIS — M48061 Spinal stenosis, lumbar region without neurogenic claudication: Secondary | ICD-10-CM | POA: Diagnosis not present

## 2017-10-15 DIAGNOSIS — M5126 Other intervertebral disc displacement, lumbar region: Secondary | ICD-10-CM | POA: Diagnosis not present

## 2017-10-15 DIAGNOSIS — I10 Essential (primary) hypertension: Secondary | ICD-10-CM | POA: Diagnosis not present

## 2017-11-19 DIAGNOSIS — G894 Chronic pain syndrome: Secondary | ICD-10-CM | POA: Diagnosis not present

## 2017-11-19 DIAGNOSIS — Z5181 Encounter for therapeutic drug level monitoring: Secondary | ICD-10-CM | POA: Diagnosis not present

## 2017-11-19 DIAGNOSIS — I10 Essential (primary) hypertension: Secondary | ICD-10-CM | POA: Diagnosis not present

## 2017-11-19 DIAGNOSIS — Z79899 Other long term (current) drug therapy: Secondary | ICD-10-CM | POA: Diagnosis not present

## 2017-12-21 DIAGNOSIS — I160 Hypertensive urgency: Secondary | ICD-10-CM | POA: Diagnosis not present

## 2017-12-21 DIAGNOSIS — Z0000 Encounter for general adult medical examination without abnormal findings: Secondary | ICD-10-CM | POA: Diagnosis not present

## 2017-12-21 DIAGNOSIS — M79605 Pain in left leg: Secondary | ICD-10-CM | POA: Diagnosis not present

## 2017-12-21 DIAGNOSIS — R5383 Other fatigue: Secondary | ICD-10-CM | POA: Diagnosis not present

## 2017-12-21 DIAGNOSIS — Z79899 Other long term (current) drug therapy: Secondary | ICD-10-CM | POA: Diagnosis not present

## 2017-12-21 DIAGNOSIS — Z13220 Encounter for screening for lipoid disorders: Secondary | ICD-10-CM | POA: Diagnosis not present

## 2017-12-21 DIAGNOSIS — D510 Vitamin B12 deficiency anemia due to intrinsic factor deficiency: Secondary | ICD-10-CM | POA: Diagnosis not present

## 2018-01-01 DIAGNOSIS — D510 Vitamin B12 deficiency anemia due to intrinsic factor deficiency: Secondary | ICD-10-CM | POA: Diagnosis not present

## 2018-01-15 DIAGNOSIS — G894 Chronic pain syndrome: Secondary | ICD-10-CM | POA: Diagnosis not present

## 2018-01-15 DIAGNOSIS — D510 Vitamin B12 deficiency anemia due to intrinsic factor deficiency: Secondary | ICD-10-CM | POA: Diagnosis not present

## 2018-01-15 DIAGNOSIS — M533 Sacrococcygeal disorders, not elsewhere classified: Secondary | ICD-10-CM | POA: Diagnosis not present

## 2018-01-15 DIAGNOSIS — M5417 Radiculopathy, lumbosacral region: Secondary | ICD-10-CM | POA: Diagnosis not present

## 2018-03-14 DIAGNOSIS — G894 Chronic pain syndrome: Secondary | ICD-10-CM | POA: Diagnosis not present

## 2018-03-14 DIAGNOSIS — M170 Bilateral primary osteoarthritis of knee: Secondary | ICD-10-CM | POA: Diagnosis not present

## 2018-03-14 DIAGNOSIS — M25561 Pain in right knee: Secondary | ICD-10-CM | POA: Diagnosis not present

## 2018-03-29 DIAGNOSIS — J45909 Unspecified asthma, uncomplicated: Secondary | ICD-10-CM | POA: Diagnosis not present

## 2018-03-29 DIAGNOSIS — K219 Gastro-esophageal reflux disease without esophagitis: Secondary | ICD-10-CM | POA: Diagnosis not present

## 2018-03-29 DIAGNOSIS — M170 Bilateral primary osteoarthritis of knee: Secondary | ICD-10-CM | POA: Diagnosis not present

## 2018-03-29 DIAGNOSIS — M25561 Pain in right knee: Secondary | ICD-10-CM | POA: Diagnosis not present

## 2018-03-29 DIAGNOSIS — Z888 Allergy status to other drugs, medicaments and biological substances status: Secondary | ICD-10-CM | POA: Diagnosis not present

## 2018-03-29 DIAGNOSIS — M25562 Pain in left knee: Secondary | ICD-10-CM | POA: Diagnosis not present

## 2018-03-29 DIAGNOSIS — Z91013 Allergy to seafood: Secondary | ICD-10-CM | POA: Diagnosis not present

## 2018-03-29 DIAGNOSIS — I10 Essential (primary) hypertension: Secondary | ICD-10-CM | POA: Diagnosis not present

## 2018-03-29 DIAGNOSIS — Z880 Allergy status to penicillin: Secondary | ICD-10-CM | POA: Diagnosis not present

## 2018-03-29 DIAGNOSIS — G43909 Migraine, unspecified, not intractable, without status migrainosus: Secondary | ICD-10-CM | POA: Diagnosis not present

## 2018-04-06 DIAGNOSIS — R05 Cough: Secondary | ICD-10-CM | POA: Diagnosis not present

## 2018-04-06 DIAGNOSIS — L255 Unspecified contact dermatitis due to plants, except food: Secondary | ICD-10-CM | POA: Diagnosis not present

## 2018-04-22 DIAGNOSIS — J029 Acute pharyngitis, unspecified: Secondary | ICD-10-CM | POA: Diagnosis not present

## 2018-05-21 DIAGNOSIS — Z5181 Encounter for therapeutic drug level monitoring: Secondary | ICD-10-CM | POA: Diagnosis not present

## 2018-05-21 DIAGNOSIS — G894 Chronic pain syndrome: Secondary | ICD-10-CM | POA: Diagnosis not present

## 2018-05-21 DIAGNOSIS — M170 Bilateral primary osteoarthritis of knee: Secondary | ICD-10-CM | POA: Diagnosis not present

## 2018-05-21 DIAGNOSIS — M25561 Pain in right knee: Secondary | ICD-10-CM | POA: Diagnosis not present

## 2018-05-21 DIAGNOSIS — Z79899 Other long term (current) drug therapy: Secondary | ICD-10-CM | POA: Diagnosis not present

## 2018-05-28 DIAGNOSIS — J45909 Unspecified asthma, uncomplicated: Secondary | ICD-10-CM | POA: Diagnosis not present

## 2018-05-28 DIAGNOSIS — Z880 Allergy status to penicillin: Secondary | ICD-10-CM | POA: Diagnosis not present

## 2018-05-28 DIAGNOSIS — I10 Essential (primary) hypertension: Secondary | ICD-10-CM | POA: Diagnosis not present

## 2018-05-28 DIAGNOSIS — M170 Bilateral primary osteoarthritis of knee: Secondary | ICD-10-CM | POA: Diagnosis not present

## 2018-05-28 DIAGNOSIS — M545 Low back pain: Secondary | ICD-10-CM | POA: Diagnosis not present

## 2018-05-28 DIAGNOSIS — Z881 Allergy status to other antibiotic agents status: Secondary | ICD-10-CM | POA: Diagnosis not present

## 2018-05-28 DIAGNOSIS — K219 Gastro-esophageal reflux disease without esophagitis: Secondary | ICD-10-CM | POA: Diagnosis not present

## 2018-05-28 DIAGNOSIS — G894 Chronic pain syndrome: Secondary | ICD-10-CM | POA: Diagnosis not present

## 2018-05-28 DIAGNOSIS — Z79899 Other long term (current) drug therapy: Secondary | ICD-10-CM | POA: Diagnosis not present

## 2018-05-28 DIAGNOSIS — G43909 Migraine, unspecified, not intractable, without status migrainosus: Secondary | ICD-10-CM | POA: Diagnosis not present

## 2018-05-28 DIAGNOSIS — Z91013 Allergy to seafood: Secondary | ICD-10-CM | POA: Diagnosis not present

## 2018-05-28 DIAGNOSIS — M25562 Pain in left knee: Secondary | ICD-10-CM | POA: Diagnosis not present

## 2018-05-28 DIAGNOSIS — Z9884 Bariatric surgery status: Secondary | ICD-10-CM | POA: Diagnosis not present

## 2018-05-28 DIAGNOSIS — M25561 Pain in right knee: Secondary | ICD-10-CM | POA: Diagnosis not present

## 2018-06-03 DIAGNOSIS — M67431 Ganglion, right wrist: Secondary | ICD-10-CM | POA: Diagnosis not present

## 2018-06-03 DIAGNOSIS — I10 Essential (primary) hypertension: Secondary | ICD-10-CM | POA: Diagnosis not present

## 2018-06-04 DIAGNOSIS — M25531 Pain in right wrist: Secondary | ICD-10-CM | POA: Diagnosis not present

## 2018-06-12 DIAGNOSIS — M25562 Pain in left knee: Secondary | ICD-10-CM | POA: Diagnosis not present

## 2018-06-12 DIAGNOSIS — K219 Gastro-esophageal reflux disease without esophagitis: Secondary | ICD-10-CM | POA: Diagnosis not present

## 2018-06-12 DIAGNOSIS — M1990 Unspecified osteoarthritis, unspecified site: Secondary | ICD-10-CM | POA: Diagnosis not present

## 2018-06-12 DIAGNOSIS — G43909 Migraine, unspecified, not intractable, without status migrainosus: Secondary | ICD-10-CM | POA: Diagnosis not present

## 2018-06-12 DIAGNOSIS — J45909 Unspecified asthma, uncomplicated: Secondary | ICD-10-CM | POA: Diagnosis not present

## 2018-06-12 DIAGNOSIS — Z880 Allergy status to penicillin: Secondary | ICD-10-CM | POA: Diagnosis not present

## 2018-06-12 DIAGNOSIS — I10 Essential (primary) hypertension: Secondary | ICD-10-CM | POA: Diagnosis not present

## 2018-06-12 DIAGNOSIS — M25561 Pain in right knee: Secondary | ICD-10-CM | POA: Diagnosis not present

## 2018-06-12 DIAGNOSIS — Z881 Allergy status to other antibiotic agents status: Secondary | ICD-10-CM | POA: Diagnosis not present

## 2018-06-12 DIAGNOSIS — Z91013 Allergy to seafood: Secondary | ICD-10-CM | POA: Diagnosis not present

## 2018-06-12 DIAGNOSIS — M545 Low back pain: Secondary | ICD-10-CM | POA: Diagnosis not present

## 2018-06-12 DIAGNOSIS — Z9884 Bariatric surgery status: Secondary | ICD-10-CM | POA: Diagnosis not present

## 2018-06-12 DIAGNOSIS — G8929 Other chronic pain: Secondary | ICD-10-CM | POA: Diagnosis not present

## 2018-06-14 DIAGNOSIS — R350 Frequency of micturition: Secondary | ICD-10-CM | POA: Diagnosis not present

## 2018-07-02 DIAGNOSIS — M25562 Pain in left knee: Secondary | ICD-10-CM | POA: Diagnosis not present

## 2018-07-02 DIAGNOSIS — I10 Essential (primary) hypertension: Secondary | ICD-10-CM | POA: Diagnosis not present

## 2018-07-02 DIAGNOSIS — Z883 Allergy status to other anti-infective agents status: Secondary | ICD-10-CM | POA: Diagnosis not present

## 2018-07-02 DIAGNOSIS — K219 Gastro-esophageal reflux disease without esophagitis: Secondary | ICD-10-CM | POA: Diagnosis not present

## 2018-07-02 DIAGNOSIS — Z880 Allergy status to penicillin: Secondary | ICD-10-CM | POA: Diagnosis not present

## 2018-07-02 DIAGNOSIS — Z91013 Allergy to seafood: Secondary | ICD-10-CM | POA: Diagnosis not present

## 2018-07-02 DIAGNOSIS — M1712 Unilateral primary osteoarthritis, left knee: Secondary | ICD-10-CM | POA: Diagnosis not present

## 2018-07-10 DIAGNOSIS — H66001 Acute suppurative otitis media without spontaneous rupture of ear drum, right ear: Secondary | ICD-10-CM | POA: Diagnosis not present

## 2018-07-10 DIAGNOSIS — J00 Acute nasopharyngitis [common cold]: Secondary | ICD-10-CM | POA: Diagnosis not present

## 2018-07-16 DIAGNOSIS — Z880 Allergy status to penicillin: Secondary | ICD-10-CM | POA: Diagnosis not present

## 2018-07-16 DIAGNOSIS — Z91013 Allergy to seafood: Secondary | ICD-10-CM | POA: Diagnosis not present

## 2018-07-16 DIAGNOSIS — M25561 Pain in right knee: Secondary | ICD-10-CM | POA: Diagnosis not present

## 2018-07-16 DIAGNOSIS — M1711 Unilateral primary osteoarthritis, right knee: Secondary | ICD-10-CM | POA: Diagnosis not present

## 2018-07-16 DIAGNOSIS — M5416 Radiculopathy, lumbar region: Secondary | ICD-10-CM | POA: Diagnosis not present

## 2018-07-16 DIAGNOSIS — G43909 Migraine, unspecified, not intractable, without status migrainosus: Secondary | ICD-10-CM | POA: Diagnosis not present

## 2018-07-16 DIAGNOSIS — D649 Anemia, unspecified: Secondary | ICD-10-CM | POA: Diagnosis not present

## 2018-07-16 DIAGNOSIS — K219 Gastro-esophageal reflux disease without esophagitis: Secondary | ICD-10-CM | POA: Diagnosis not present

## 2018-07-16 DIAGNOSIS — J45909 Unspecified asthma, uncomplicated: Secondary | ICD-10-CM | POA: Diagnosis not present

## 2018-07-16 DIAGNOSIS — Z881 Allergy status to other antibiotic agents status: Secondary | ICD-10-CM | POA: Diagnosis not present

## 2018-07-16 DIAGNOSIS — M47816 Spondylosis without myelopathy or radiculopathy, lumbar region: Secondary | ICD-10-CM | POA: Diagnosis not present

## 2018-07-16 DIAGNOSIS — G894 Chronic pain syndrome: Secondary | ICD-10-CM | POA: Diagnosis not present

## 2018-07-16 DIAGNOSIS — M5136 Other intervertebral disc degeneration, lumbar region: Secondary | ICD-10-CM | POA: Diagnosis not present

## 2018-07-16 DIAGNOSIS — I10 Essential (primary) hypertension: Secondary | ICD-10-CM | POA: Diagnosis not present

## 2018-07-19 DIAGNOSIS — R112 Nausea with vomiting, unspecified: Secondary | ICD-10-CM | POA: Diagnosis not present

## 2018-07-19 DIAGNOSIS — R197 Diarrhea, unspecified: Secondary | ICD-10-CM | POA: Diagnosis not present

## 2018-07-19 DIAGNOSIS — R5081 Fever presenting with conditions classified elsewhere: Secondary | ICD-10-CM | POA: Diagnosis not present

## 2018-08-15 DIAGNOSIS — R350 Frequency of micturition: Secondary | ICD-10-CM | POA: Diagnosis not present

## 2018-08-19 DIAGNOSIS — N390 Urinary tract infection, site not specified: Secondary | ICD-10-CM | POA: Diagnosis not present

## 2018-08-19 DIAGNOSIS — M25521 Pain in right elbow: Secondary | ICD-10-CM | POA: Diagnosis not present

## 2018-08-19 DIAGNOSIS — I10 Essential (primary) hypertension: Secondary | ICD-10-CM | POA: Diagnosis not present

## 2018-08-22 DIAGNOSIS — M25561 Pain in right knee: Secondary | ICD-10-CM | POA: Diagnosis not present

## 2018-08-22 DIAGNOSIS — M25562 Pain in left knee: Secondary | ICD-10-CM | POA: Diagnosis not present

## 2018-08-22 DIAGNOSIS — G8929 Other chronic pain: Secondary | ICD-10-CM | POA: Diagnosis not present

## 2018-08-22 DIAGNOSIS — M47816 Spondylosis without myelopathy or radiculopathy, lumbar region: Secondary | ICD-10-CM | POA: Diagnosis not present

## 2018-08-22 DIAGNOSIS — G894 Chronic pain syndrome: Secondary | ICD-10-CM | POA: Diagnosis not present

## 2018-09-16 DIAGNOSIS — R05 Cough: Secondary | ICD-10-CM | POA: Diagnosis not present

## 2018-09-16 DIAGNOSIS — I10 Essential (primary) hypertension: Secondary | ICD-10-CM | POA: Diagnosis not present

## 2018-09-16 DIAGNOSIS — K219 Gastro-esophageal reflux disease without esophagitis: Secondary | ICD-10-CM | POA: Diagnosis not present

## 2018-10-17 DIAGNOSIS — M48062 Spinal stenosis, lumbar region with neurogenic claudication: Secondary | ICD-10-CM | POA: Diagnosis not present

## 2018-10-17 DIAGNOSIS — M5417 Radiculopathy, lumbosacral region: Secondary | ICD-10-CM | POA: Diagnosis not present

## 2018-10-17 DIAGNOSIS — M47816 Spondylosis without myelopathy or radiculopathy, lumbar region: Secondary | ICD-10-CM | POA: Diagnosis not present

## 2018-10-17 DIAGNOSIS — G894 Chronic pain syndrome: Secondary | ICD-10-CM | POA: Diagnosis not present

## 2018-11-20 DIAGNOSIS — G4709 Other insomnia: Secondary | ICD-10-CM | POA: Diagnosis not present

## 2018-11-20 DIAGNOSIS — I10 Essential (primary) hypertension: Secondary | ICD-10-CM | POA: Diagnosis not present

## 2018-11-20 DIAGNOSIS — M65849 Other synovitis and tenosynovitis, unspecified hand: Secondary | ICD-10-CM | POA: Diagnosis not present

## 2018-12-04 DIAGNOSIS — I10 Essential (primary) hypertension: Secondary | ICD-10-CM | POA: Diagnosis not present

## 2018-12-04 DIAGNOSIS — G4709 Other insomnia: Secondary | ICD-10-CM | POA: Diagnosis not present

## 2018-12-07 DIAGNOSIS — Z0389 Encounter for observation for other suspected diseases and conditions ruled out: Secondary | ICD-10-CM | POA: Diagnosis not present

## 2018-12-07 DIAGNOSIS — J028 Acute pharyngitis due to other specified organisms: Secondary | ICD-10-CM | POA: Diagnosis not present

## 2018-12-12 DIAGNOSIS — G894 Chronic pain syndrome: Secondary | ICD-10-CM | POA: Diagnosis not present

## 2018-12-12 DIAGNOSIS — M48062 Spinal stenosis, lumbar region with neurogenic claudication: Secondary | ICD-10-CM | POA: Diagnosis not present

## 2018-12-12 DIAGNOSIS — M47816 Spondylosis without myelopathy or radiculopathy, lumbar region: Secondary | ICD-10-CM | POA: Diagnosis not present

## 2018-12-12 DIAGNOSIS — M5417 Radiculopathy, lumbosacral region: Secondary | ICD-10-CM | POA: Diagnosis not present

## 2019-01-01 DIAGNOSIS — R946 Abnormal results of thyroid function studies: Secondary | ICD-10-CM | POA: Diagnosis not present

## 2019-01-01 DIAGNOSIS — G4709 Other insomnia: Secondary | ICD-10-CM | POA: Diagnosis not present

## 2019-01-01 DIAGNOSIS — Z23 Encounter for immunization: Secondary | ICD-10-CM | POA: Diagnosis not present

## 2019-01-01 DIAGNOSIS — I10 Essential (primary) hypertension: Secondary | ICD-10-CM | POA: Diagnosis not present

## 2019-01-06 DIAGNOSIS — R05 Cough: Secondary | ICD-10-CM | POA: Diagnosis not present

## 2019-01-16 DIAGNOSIS — G894 Chronic pain syndrome: Secondary | ICD-10-CM | POA: Diagnosis not present

## 2019-01-16 DIAGNOSIS — M48062 Spinal stenosis, lumbar region with neurogenic claudication: Secondary | ICD-10-CM | POA: Diagnosis not present

## 2019-01-16 DIAGNOSIS — M47816 Spondylosis without myelopathy or radiculopathy, lumbar region: Secondary | ICD-10-CM | POA: Diagnosis not present

## 2019-01-16 DIAGNOSIS — M5417 Radiculopathy, lumbosacral region: Secondary | ICD-10-CM | POA: Diagnosis not present

## 2019-02-13 DIAGNOSIS — M5417 Radiculopathy, lumbosacral region: Secondary | ICD-10-CM | POA: Diagnosis not present

## 2019-02-13 DIAGNOSIS — M48061 Spinal stenosis, lumbar region without neurogenic claudication: Secondary | ICD-10-CM | POA: Diagnosis not present

## 2019-02-13 DIAGNOSIS — G894 Chronic pain syndrome: Secondary | ICD-10-CM | POA: Diagnosis not present

## 2019-02-13 DIAGNOSIS — M47816 Spondylosis without myelopathy or radiculopathy, lumbar region: Secondary | ICD-10-CM | POA: Diagnosis not present

## 2019-02-20 DIAGNOSIS — R05 Cough: Secondary | ICD-10-CM | POA: Diagnosis not present

## 2019-02-26 ENCOUNTER — Encounter (HOSPITAL_COMMUNITY): Payer: Self-pay

## 2019-02-26 ENCOUNTER — Emergency Department (HOSPITAL_COMMUNITY)
Admission: EM | Admit: 2019-02-26 | Discharge: 2019-02-27 | Disposition: A | Discharge status: Home / Self Care | Payer: Medicare Other | Attending: Emergency Medicine | Admitting: Emergency Medicine

## 2019-02-26 ENCOUNTER — Other Ambulatory Visit: Payer: Self-pay

## 2019-02-26 ENCOUNTER — Emergency Department (HOSPITAL_COMMUNITY): Payer: Medicare Other

## 2019-02-26 DIAGNOSIS — Z79899 Other long term (current) drug therapy: Secondary | ICD-10-CM | POA: Insufficient documentation

## 2019-02-26 DIAGNOSIS — R1084 Generalized abdominal pain: Secondary | ICD-10-CM | POA: Diagnosis not present

## 2019-02-26 DIAGNOSIS — N3000 Acute cystitis without hematuria: Secondary | ICD-10-CM | POA: Insufficient documentation

## 2019-02-26 DIAGNOSIS — I959 Hypotension, unspecified: Secondary | ICD-10-CM | POA: Diagnosis not present

## 2019-02-26 DIAGNOSIS — Z880 Allergy status to penicillin: Secondary | ICD-10-CM | POA: Insufficient documentation

## 2019-02-26 DIAGNOSIS — J45909 Unspecified asthma, uncomplicated: Secondary | ICD-10-CM | POA: Diagnosis not present

## 2019-02-26 DIAGNOSIS — R0789 Other chest pain: Secondary | ICD-10-CM

## 2019-02-26 DIAGNOSIS — R109 Unspecified abdominal pain: Secondary | ICD-10-CM | POA: Diagnosis not present

## 2019-02-26 DIAGNOSIS — R079 Chest pain, unspecified: Secondary | ICD-10-CM | POA: Diagnosis not present

## 2019-02-26 LAB — I-STAT BETA HCG BLOOD, ED (MC, WL, AP ONLY): I-stat hCG, quantitative: <5 m[IU]/mL (ref <5)

## 2019-02-26 LAB — BASIC METABOLIC PANEL
Anion gap: 12 (ref 5–15)
BUN: 25 mg/dL — ABNORMAL HIGH (ref 6–20)
CO2: 22 mmol/L (ref 22–32)
Calcium: 9.1 mg/dL (ref 8.9–10.3)
Chloride: 105 mmol/L (ref 98–111)
Creatinine, Ser: 1.17 mg/dL — ABNORMAL HIGH (ref 0.44–1.00)
GFR calc Af Amer: >60 mL/min (ref >60)
GFR calc non Af Amer: 53 mL/min — ABNORMAL LOW (ref >60)
Glucose, Bld: 101 mg/dL — ABNORMAL HIGH (ref 70–99)
Potassium: 3.8 mmol/L (ref 3.5–5.1)
Sodium: 139 mmol/L (ref 135–145)

## 2019-02-26 LAB — CBC
HCT: 35.1 % — ABNORMAL LOW (ref 36.0–46.0)
Hemoglobin: 11 g/dL — ABNORMAL LOW (ref 12.0–15.0)
MCH: 26.6 pg (ref 26.0–34.0)
MCHC: 31.3 g/dL (ref 30.0–36.0)
MCV: 84.8 fL (ref 80.0–100.0)
Platelets: 441 10*3/uL — ABNORMAL HIGH (ref 150–400)
RBC: 4.14 MIL/uL (ref 3.87–5.11)
RDW: 17.2 % — ABNORMAL HIGH (ref 11.5–15.5)
WBC: 6.6 10*3/uL (ref 4.0–10.5)
nRBC: 0 % (ref 0.0–0.2)

## 2019-02-26 LAB — TROPONIN I (HIGH SENSITIVITY): Troponin I (High Sensitivity): 7 ng/L (ref <18)

## 2019-02-26 MED ORDER — SODIUM CHLORIDE 0.9% FLUSH
3.0000 mL | Freq: Once | INTRAVENOUS | Status: AC
  Administered 2019-02-27: 3 mL via INTRAVENOUS

## 2019-02-26 NOTE — ED Triage Notes (Signed)
Pt arrived by EMS with c/o chest pain x2 days that increased today. Also has abd pain. Patient rates pain a 10/10 that increases with exertion and deep breaths. Patient took oxycodone and gabapentin at home with no relief and called ems. Had a covid test 1 week (negative). No nausea/vomiting

## 2019-02-27 ENCOUNTER — Emergency Department (HOSPITAL_COMMUNITY): Payer: Medicare Other

## 2019-02-27 DIAGNOSIS — R109 Unspecified abdominal pain: Secondary | ICD-10-CM | POA: Diagnosis not present

## 2019-02-27 DIAGNOSIS — R079 Chest pain, unspecified: Secondary | ICD-10-CM | POA: Diagnosis not present

## 2019-02-27 DIAGNOSIS — R0789 Other chest pain: Secondary | ICD-10-CM | POA: Diagnosis not present

## 2019-02-27 LAB — HEPATIC FUNCTION PANEL
ALT: 13 U/L (ref 0–44)
AST: 20 U/L (ref 15–41)
Albumin: 3.9 g/dL (ref 3.5–5.0)
Alkaline Phosphatase: 86 U/L (ref 38–126)
Bilirubin, Direct: 0.1 mg/dL (ref 0.0–0.2)
Indirect Bilirubin: 0.6 mg/dL (ref 0.3–0.9)
Total Bilirubin: 0.7 mg/dL (ref 0.3–1.2)
Total Protein: 8.1 g/dL (ref 6.5–8.1)

## 2019-02-27 LAB — URINALYSIS, ROUTINE W REFLEX MICROSCOPIC
Bilirubin Urine: NEGATIVE
Glucose, UA: NEGATIVE mg/dL
Ketones, ur: NEGATIVE mg/dL
Nitrite: NEGATIVE
Protein, ur: NEGATIVE mg/dL
Specific Gravity, Urine: 1.016 (ref 1.005–1.030)
WBC, UA: >50 WBC/hpf — ABNORMAL HIGH (ref 0–5)
pH: 5 (ref 5.0–8.0)

## 2019-02-27 LAB — TROPONIN I (HIGH SENSITIVITY): Troponin I (High Sensitivity): 4 ng/L (ref <18)

## 2019-02-27 LAB — LIPASE, BLOOD: Lipase: 23 U/L (ref 11–51)

## 2019-02-27 MED ORDER — SODIUM CHLORIDE 0.9 % IV SOLN
1.0000 g | Freq: Once | INTRAVENOUS | Status: AC
  Administered 2019-02-27: 1 g via INTRAVENOUS
  Filled 2019-02-27: qty 10

## 2019-02-27 MED ORDER — MORPHINE SULFATE (PF) 4 MG/ML IV SOLN
4.0000 mg | Freq: Once | INTRAVENOUS | Status: AC
  Administered 2019-02-27: 4 mg via INTRAVENOUS
  Filled 2019-02-27: qty 1

## 2019-02-27 MED ORDER — IOHEXOL 350 MG/ML SOLN
125.0000 mL | Freq: Once | INTRAVENOUS | Status: AC | PRN
  Administered 2019-02-27: 125 mL via INTRAVENOUS

## 2019-02-27 MED ORDER — SODIUM CHLORIDE 0.9 % IV BOLUS (SEPSIS)
1000.0000 mL | Freq: Once | INTRAVENOUS | Status: AC
  Administered 2019-02-27: 1000 mL via INTRAVENOUS

## 2019-02-27 MED ORDER — CEPHALEXIN 250 MG PO CAPS: 250.0000 mg | ORAL_CAPSULE | Freq: Four times a day (QID) | ORAL | 0 refills | Status: AC

## 2019-02-27 MED ORDER — ONDANSETRON HCL 4 MG/2ML IJ SOLN
4.0000 mg | Freq: Once | INTRAMUSCULAR | Status: AC
  Administered 2019-02-27: 4 mg via INTRAVENOUS
  Filled 2019-02-27: qty 2

## 2019-02-27 NOTE — ED Provider Notes (Addendum)
TIME SEEN: 4:02 AM  CHIEF COMPLAINT: Chest pain, abdominal pain  HPI: Patient is a [age] year old female with history of morbid obesity, cholelithiasis status post cholecystectomy, asthma who presents to the emergency department with right-sided sharp chest pain worse with palpation, movement and deep inspiration as well as right-sided abdominal pain for the past day.  She states the pain makes her feel very short of breath.  No fevers or cough.  No lower extremity swelling or pain.  No other previous abdominal surgery.  No vomiting or diarrhea.  No dysuria, vaginal bleeding or discharge.  ROS: See HPI Constitutional: no fever  Eyes: no drainage  ENT: no runny nose   Cardiovascular:   chest pain  Resp:  SOB  GI: no vomiting GU: no dysuria Integumentary: no rash  Allergy: no hives  Musculoskeletal: no leg swelling  Neurological: no slurred speech ROS otherwise negative  PAST MEDICAL HISTORY/PAST SURGICAL HISTORY:  Past Medical History:  Diagnosis Date  . Asthma    f/b Dr. Lucie LeatherKozlow  . Cholelithiasis without cholecystitis   . Chronic back pain    f/b [facility]  . Migraine   . Morbid obesity (HCC)   . Reflux   . Seasonal allergies     MEDICATIONS:  Prior to Admission medications   Medication Sig Start Date End Date Taking? Authorizing Provider  albuterol (PROVENTIL HFA;VENTOLIN HFA) 108 (90 Base) MCG/ACT inhaler Inhale 2 puffs into the lungs 3 (three) times daily as needed for wheezing or shortness of breath.    [provider]  ALPRAZolam Prudy Feeler(XANAX) 0.25 MG tablet Take 0.25-0.5 mg by mouth at bedtime as needed for anxiety.    [provider]  amLODipine (NORVASC) 5 MG tablet Take 5 mg by mouth daily.    [provider]  gabapentin (NEURONTIN) 600 MG tablet Take 600 mg by mouth 3 (three) times daily.    [provider]  hydrochlorothiazide (HYDRODIURIL) 25 MG tablet Take 25 mg by mouth daily.    [provider]  metoprolol  succinate (TOPROL-XL) 50 MG 24 hr tablet Take 50 mg by mouth daily. Take with or immediately following a meal.    [provider]  oxyCODONE (ROXICODONE) 15 MG immediate release tablet Take 15 mg by mouth every 4 (four) hours as needed for pain (every 4-6 hrs prn).    [provider]  traMADol (ULTRAM) 50 MG tablet Take by mouth every 6 (six) hours as needed.    [provider]  venlafaxine XR (EFFEXOR-XR) 150 MG 24 hr capsule Take 150 mg by mouth daily with breakfast.    [provider]    ALLERGIES:  Allergies  Allergen Reactions  . Penicillins Other (See Comments)    Severe reaction   . Shrimp [Shellfish Allergy]   . Vigamox [Moxifloxacin] Swelling    Some eye swelling      SOCIAL HISTORY:  Social History   Tobacco Use  . Smoking status: Never Smoker  . Smokeless tobacco: Never Used  Substance Use Topics  . Alcohol use: No    Alcohol/week: 0.0 standard drinks    FAMILY HISTORY: Family History  Problem Relation Age of Onset  . Diabetes Mother   . Heart disease Father   . Cancer Sister        bone    EXAM: BP (!) 114/98 (BP Location: Left Wrist)   Pulse 69   Temp 98.2 F (36.8 C) (Oral)   Resp 18   Ht 5\' 2"  (1.575 m)  Wt 129.3 kg   SpO2 98%   BMI 52.13 kg/m  CONSTITUTIONAL: Alert and oriented and responds appropriately to questions.  Appears uncomfortable, morbidly obese HEAD: Normocephalic EYES: Conjunctivae clear, pupils appear equal, EOMI ENT: normal nose; moist mucous membranes NECK: Supple, no meningismus, no nuchal rigidity, no LAD  CARD: RRR; S1 and S2 appreciated; no murmurs, no clicks, no rubs, no gallops CHEST:  Chest wall is mildly tender to palpation over the right anterior chest wall.  No crepitus, ecchymosis, erythema, warmth, rash or other lesions present.   RESP: Patient appears to be splinting secondary to pain.  Breath sounds clear and equal bilaterally; no wheezes, no rhonchi, no rales, no hypoxia or  respiratory distress, speaking full sentences ABD/GI: Normal bowel sounds; non-distended; soft, diffusely tender in the right upper and right lower quadrant with intermittent voluntary guarding, no rebound, exam is limited secondary to morbid obesity BACK:  The back appears normal and is non-tender to palpation, there is no CVA tenderness EXT: Normal ROM in all joints; non-tender to palpation; no edema; normal capillary refill; no cyanosis, no calf tenderness or swelling    SKIN: Normal color for age and race; warm; no rash NEURO: Moves all extremities equally PSYCH: The patient's mood and manner are appropriate. Grooming and personal hygiene are appropriate.  MEDICAL DECISION MAKING: Patient here with chest pain and abdominal pain.  She has had a previous cholecystectomy.  Differential includes pneumonia, PE, appendicitis, colitis, bowel obstruction.  Less likely ACS given pain seems atypical and she has not had 2 negative troponins.  Chest x-ray obtained in triage is unremarkable.  EKG shows no ischemia or interval changes or arrhythmia.  Will give pain medication and obtain CT imaging of both the chest as well as abdomen pelvis.  I have a lower suspicion that this is a dissection.  Will obtain a CT of the chest rather than a d-dimer as I feel she is high risk for PE given her morbid obesity keeps her rather immobile.  ED PROGRESS: Patient's urine appears infected.  We will send culture and give her Rocephin.  Pyelonephritis also on the differential.  Patient CT scan shows no acute abnormality.  No PE, pneumonia, edema.  No appendicitis, signs of pyelonephritis, colitis, diverticulitis, bowel obstruction.  Patient could have early pyelonephritis given she is having some abdominal pain up higher and flank pain.  She is getting a dose of IV ceftriaxone here and will discharge on Keflex 4 times a day for a week.  I feel she is safe to be discharged.  She has no nausea or vomiting.  Discussed with her that  I think that her chest pain is likely from chest wall pain as it is reproducible with palpation and worse with movement.  It appears that she is on Percocet chronically.  Will not prescribe any further narcotic pain medicine to take home.  Discussed return precautions.  I feel she is safe to be discharged home.  She is comfortable with this plan.  At this time, I do not feel there is any life-threatening condition present. I have reviewed and discussed all results (EKG, imaging, lab, urine as appropriate) and exam findings with patient/family. I have reviewed nursing notes and appropriate previous records.  I feel the patient is safe to be discharged home without further emergent workup and can continue workup as an outpatient as needed. Discussed usual and customary return precautions. Patient/family verbalize understanding and are comfortable with this plan.  Outpatient follow-up has been provided  as needed. All questions have been answered.    EKG Interpretation  Date/Time:  Wednesday February 26 2019 21:00:49 EDT Ventricular Rate:  77 PR Interval:  180 QRS Duration: 86 QT Interval:  372 QTC Calculation: 420 R Axis:   59 Text Interpretation:  Normal sinus rhythm Nonspecific T wave abnormality Abnormal ECG No old tracing to compare Confirmed by Keny Donald, Baxter HireKristen [phone]([id number]) on 02/27/2019 4:45:36 AM         Verlena Marlette, Layla MawKristen N, DO 02/27/19 0646    Lisa Milian, Layla MawKristen N, DO 02/27/19 [phone]

## 2019-02-27 NOTE — Discharge Instructions (Signed)
Please continue taking your Percocet as prescribed.  We cannot discharge you with further narcotic pain medication from the emergency department given you receive regular narcotic prescriptions from a physician.  Your cardiac work-up today was normal.  CT of your chest and abdomen did not show any acute abnormalities.  We are treating you for a UTI that could also be an early kidney infection.  Please take your antibiotics as prescribed until complete.

## 2019-03-01 LAB — URINE CULTURE: Culture: >=100000 — AB

## 2019-03-02 ENCOUNTER — Telehealth: Payer: Self-pay | Admitting: Emergency Medicine

## 2019-03-02 NOTE — Telephone Encounter (Signed)
Post ED Visit - Positive Culture Follow-up  Culture report reviewed by antimicrobial stewardship pharmacist: [place] Team []  Elenor Quinones, Pharm.D. []  Heide Guile, Pharm.D., BCPS AQ-ID []  Parks Neptune, Pharm.D., BCPS []  Alycia Rossetti, Pharm.D., BCPS []  [place], Pharm.D., BCPS, AAHIVP []  Legrand Como, Pharm.D., BCPS, AAHIVP []  Salome Arnt, PharmD, BCPS []  Johnnette Gourd, PharmD, BCPS [x]  Hughes Better, PharmD, BCPS []  Leeroy Cha, PharmD []  Laqueta Linden, PharmD, BCPS []  Albertina Parr, PharmD  [place] Team []  Leodis Sias, PharmD []  Lindell Spar, PharmD []  Royetta Asal, PharmD []  Graylin Shiver, Rph []  Rema Fendt) Glennon Mac, PharmD []  Arlyn Dunning, PharmD []  Netta Cedars, PharmD []  Dia Sitter, PharmD []  Leone Haven, PharmD []  Gretta Arab, PharmD []  Theodis Shove, PharmD []  Peggyann Juba, PharmD []  Reuel Boom, PharmD   Positive urine culture Treated with Cephalexin, organism sensitive to the same and no further patient follow-up is required at this time.  Sandi Raveling Alis Sawchuk 03/02/2019, 2:00 PM

## 2019-03-13 DIAGNOSIS — M47816 Spondylosis without myelopathy or radiculopathy, lumbar region: Secondary | ICD-10-CM | POA: Diagnosis not present

## 2019-03-13 DIAGNOSIS — M5417 Radiculopathy, lumbosacral region: Secondary | ICD-10-CM | POA: Diagnosis not present

## 2019-03-13 DIAGNOSIS — G894 Chronic pain syndrome: Secondary | ICD-10-CM | POA: Diagnosis not present

## 2019-03-13 DIAGNOSIS — M48061 Spinal stenosis, lumbar region without neurogenic claudication: Secondary | ICD-10-CM | POA: Diagnosis not present

## 2019-03-19 DIAGNOSIS — Z79899 Other long term (current) drug therapy: Secondary | ICD-10-CM | POA: Diagnosis not present

## 2019-03-19 DIAGNOSIS — G894 Chronic pain syndrome: Secondary | ICD-10-CM | POA: Diagnosis not present

## 2019-03-19 DIAGNOSIS — Z9049 Acquired absence of other specified parts of digestive tract: Secondary | ICD-10-CM | POA: Diagnosis not present

## 2019-03-19 DIAGNOSIS — Z9884 Bariatric surgery status: Secondary | ICD-10-CM | POA: Diagnosis not present

## 2019-03-19 DIAGNOSIS — Z833 Family history of diabetes mellitus: Secondary | ICD-10-CM | POA: Diagnosis not present

## 2019-03-19 DIAGNOSIS — I10 Essential (primary) hypertension: Secondary | ICD-10-CM | POA: Diagnosis not present

## 2019-03-19 DIAGNOSIS — Z5181 Encounter for therapeutic drug level monitoring: Secondary | ICD-10-CM | POA: Diagnosis not present

## 2019-03-19 DIAGNOSIS — M48061 Spinal stenosis, lumbar region without neurogenic claudication: Secondary | ICD-10-CM | POA: Diagnosis not present

## 2019-03-19 DIAGNOSIS — M5417 Radiculopathy, lumbosacral region: Secondary | ICD-10-CM | POA: Diagnosis not present

## 2019-03-19 DIAGNOSIS — G43909 Migraine, unspecified, not intractable, without status migrainosus: Secondary | ICD-10-CM | POA: Diagnosis not present

## 2019-03-19 DIAGNOSIS — Z8249 Family history of ischemic heart disease and other diseases of the circulatory system: Secondary | ICD-10-CM | POA: Diagnosis not present

## 2019-03-19 DIAGNOSIS — Z91013 Allergy to seafood: Secondary | ICD-10-CM | POA: Diagnosis not present

## 2019-03-19 DIAGNOSIS — M1990 Unspecified osteoarthritis, unspecified site: Secondary | ICD-10-CM | POA: Diagnosis not present

## 2019-03-19 DIAGNOSIS — Z880 Allergy status to penicillin: Secondary | ICD-10-CM | POA: Diagnosis not present

## 2019-03-19 DIAGNOSIS — M47816 Spondylosis without myelopathy or radiculopathy, lumbar region: Secondary | ICD-10-CM | POA: Diagnosis not present

## 2019-04-14 DIAGNOSIS — M170 Bilateral primary osteoarthritis of knee: Secondary | ICD-10-CM | POA: Diagnosis not present

## 2019-04-14 DIAGNOSIS — G894 Chronic pain syndrome: Secondary | ICD-10-CM | POA: Diagnosis not present

## 2019-04-14 DIAGNOSIS — M25561 Pain in right knee: Secondary | ICD-10-CM | POA: Diagnosis not present

## 2019-04-25 DIAGNOSIS — I10 Essential (primary) hypertension: Secondary | ICD-10-CM | POA: Diagnosis not present

## 2019-05-01 DIAGNOSIS — H43813 Vitreous degeneration, bilateral: Secondary | ICD-10-CM | POA: Diagnosis not present

## 2019-05-02 DIAGNOSIS — H5213 Myopia, bilateral: Secondary | ICD-10-CM | POA: Diagnosis not present

## 2019-05-02 DIAGNOSIS — H524 Presbyopia: Secondary | ICD-10-CM | POA: Diagnosis not present

## 2019-05-06 DIAGNOSIS — M1712 Unilateral primary osteoarthritis, left knee: Secondary | ICD-10-CM | POA: Diagnosis not present

## 2019-05-06 DIAGNOSIS — M25562 Pain in left knee: Secondary | ICD-10-CM | POA: Diagnosis not present

## 2019-05-06 DIAGNOSIS — M25561 Pain in right knee: Secondary | ICD-10-CM | POA: Diagnosis not present

## 2019-05-06 DIAGNOSIS — Z881 Allergy status to other antibiotic agents status: Secondary | ICD-10-CM | POA: Diagnosis not present

## 2019-05-06 DIAGNOSIS — Z903 Acquired absence of stomach [part of]: Secondary | ICD-10-CM | POA: Diagnosis not present

## 2019-05-06 DIAGNOSIS — Z91013 Allergy to seafood: Secondary | ICD-10-CM | POA: Diagnosis not present

## 2019-05-06 DIAGNOSIS — I10 Essential (primary) hypertension: Secondary | ICD-10-CM | POA: Diagnosis not present

## 2019-05-06 DIAGNOSIS — Z880 Allergy status to penicillin: Secondary | ICD-10-CM | POA: Diagnosis not present

## 2019-05-20 DIAGNOSIS — K219 Gastro-esophageal reflux disease without esophagitis: Secondary | ICD-10-CM | POA: Diagnosis not present

## 2019-05-20 DIAGNOSIS — Z881 Allergy status to other antibiotic agents status: Secondary | ICD-10-CM | POA: Diagnosis not present

## 2019-05-20 DIAGNOSIS — Z8249 Family history of ischemic heart disease and other diseases of the circulatory system: Secondary | ICD-10-CM | POA: Diagnosis not present

## 2019-05-20 DIAGNOSIS — J45909 Unspecified asthma, uncomplicated: Secondary | ICD-10-CM | POA: Diagnosis not present

## 2019-05-20 DIAGNOSIS — I10 Essential (primary) hypertension: Secondary | ICD-10-CM | POA: Diagnosis not present

## 2019-05-20 DIAGNOSIS — G894 Chronic pain syndrome: Secondary | ICD-10-CM | POA: Diagnosis not present

## 2019-05-20 DIAGNOSIS — M25561 Pain in right knee: Secondary | ICD-10-CM | POA: Diagnosis not present

## 2019-05-20 DIAGNOSIS — M1711 Unilateral primary osteoarthritis, right knee: Secondary | ICD-10-CM | POA: Diagnosis not present

## 2019-05-20 DIAGNOSIS — Z833 Family history of diabetes mellitus: Secondary | ICD-10-CM | POA: Diagnosis not present

## 2019-05-20 DIAGNOSIS — G43909 Migraine, unspecified, not intractable, without status migrainosus: Secondary | ICD-10-CM | POA: Diagnosis not present

## 2019-05-20 DIAGNOSIS — M5417 Radiculopathy, lumbosacral region: Secondary | ICD-10-CM | POA: Diagnosis not present

## 2019-05-20 DIAGNOSIS — Z880 Allergy status to penicillin: Secondary | ICD-10-CM | POA: Diagnosis not present

## 2019-05-20 DIAGNOSIS — M5136 Other intervertebral disc degeneration, lumbar region: Secondary | ICD-10-CM | POA: Diagnosis not present

## 2019-05-20 DIAGNOSIS — M48061 Spinal stenosis, lumbar region without neurogenic claudication: Secondary | ICD-10-CM | POA: Diagnosis not present

## 2019-05-20 DIAGNOSIS — M170 Bilateral primary osteoarthritis of knee: Secondary | ICD-10-CM | POA: Diagnosis not present

## 2019-05-20 DIAGNOSIS — Z9884 Bariatric surgery status: Secondary | ICD-10-CM | POA: Diagnosis not present

## 2019-05-20 DIAGNOSIS — M5442 Lumbago with sciatica, left side: Secondary | ICD-10-CM | POA: Diagnosis not present

## 2019-05-20 DIAGNOSIS — Z91013 Allergy to seafood: Secondary | ICD-10-CM | POA: Diagnosis not present

## 2019-06-06 DIAGNOSIS — U071 COVID-19: Secondary | ICD-10-CM | POA: Diagnosis not present

## 2019-06-10 DIAGNOSIS — M170 Bilateral primary osteoarthritis of knee: Secondary | ICD-10-CM | POA: Diagnosis not present

## 2019-06-10 DIAGNOSIS — G894 Chronic pain syndrome: Secondary | ICD-10-CM | POA: Diagnosis not present

## 2019-06-10 DIAGNOSIS — M25561 Pain in right knee: Secondary | ICD-10-CM | POA: Diagnosis not present

## 2019-06-10 DIAGNOSIS — M5417 Radiculopathy, lumbosacral region: Secondary | ICD-10-CM | POA: Diagnosis not present

## 2019-06-13 DIAGNOSIS — I10 Essential (primary) hypertension: Secondary | ICD-10-CM | POA: Diagnosis not present

## 2019-06-13 DIAGNOSIS — J449 Chronic obstructive pulmonary disease, unspecified: Secondary | ICD-10-CM | POA: Diagnosis not present

## 2019-06-13 DIAGNOSIS — R05 Cough: Secondary | ICD-10-CM | POA: Diagnosis not present

## 2019-07-09 DIAGNOSIS — M940 Chondrocostal junction syndrome [Tietze]: Secondary | ICD-10-CM | POA: Diagnosis not present

## 2019-07-09 DIAGNOSIS — J302 Other seasonal allergic rhinitis: Secondary | ICD-10-CM | POA: Diagnosis not present

## 2019-07-09 DIAGNOSIS — I10 Essential (primary) hypertension: Secondary | ICD-10-CM | POA: Diagnosis not present

## 2019-07-13 DIAGNOSIS — J449 Chronic obstructive pulmonary disease, unspecified: Secondary | ICD-10-CM | POA: Diagnosis not present

## 2019-07-28 DIAGNOSIS — Z1231 Encounter for screening mammogram for malignant neoplasm of breast: Secondary | ICD-10-CM | POA: Diagnosis not present

## 2019-08-13 DIAGNOSIS — J449 Chronic obstructive pulmonary disease, unspecified: Secondary | ICD-10-CM | POA: Diagnosis not present

## 2019-08-21 DIAGNOSIS — Z5181 Encounter for therapeutic drug level monitoring: Secondary | ICD-10-CM | POA: Diagnosis not present

## 2019-08-21 DIAGNOSIS — Z79899 Other long term (current) drug therapy: Secondary | ICD-10-CM | POA: Diagnosis not present

## 2019-09-02 DIAGNOSIS — M25561 Pain in right knee: Secondary | ICD-10-CM | POA: Diagnosis not present

## 2019-09-02 DIAGNOSIS — M5417 Radiculopathy, lumbosacral region: Secondary | ICD-10-CM | POA: Diagnosis not present

## 2019-09-02 DIAGNOSIS — M170 Bilateral primary osteoarthritis of knee: Secondary | ICD-10-CM | POA: Diagnosis not present

## 2019-09-02 DIAGNOSIS — G894 Chronic pain syndrome: Secondary | ICD-10-CM | POA: Diagnosis not present

## 2019-09-10 DIAGNOSIS — K219 Gastro-esophageal reflux disease without esophagitis: Secondary | ICD-10-CM | POA: Diagnosis not present

## 2019-09-10 DIAGNOSIS — G8929 Other chronic pain: Secondary | ICD-10-CM | POA: Diagnosis not present

## 2019-09-10 DIAGNOSIS — I10 Essential (primary) hypertension: Secondary | ICD-10-CM | POA: Diagnosis not present

## 2019-09-10 DIAGNOSIS — E7800 Pure hypercholesterolemia, unspecified: Secondary | ICD-10-CM | POA: Diagnosis not present

## 2019-09-13 DIAGNOSIS — J449 Chronic obstructive pulmonary disease, unspecified: Secondary | ICD-10-CM | POA: Diagnosis not present

## 2019-10-11 DIAGNOSIS — J449 Chronic obstructive pulmonary disease, unspecified: Secondary | ICD-10-CM | POA: Diagnosis not present

## 2019-11-11 DIAGNOSIS — M170 Bilateral primary osteoarthritis of knee: Secondary | ICD-10-CM | POA: Diagnosis not present

## 2019-11-11 DIAGNOSIS — G894 Chronic pain syndrome: Secondary | ICD-10-CM | POA: Diagnosis not present

## 2019-11-11 DIAGNOSIS — M5417 Radiculopathy, lumbosacral region: Secondary | ICD-10-CM | POA: Diagnosis not present

## 2019-11-11 DIAGNOSIS — J449 Chronic obstructive pulmonary disease, unspecified: Secondary | ICD-10-CM | POA: Diagnosis not present

## 2019-11-11 DIAGNOSIS — M25561 Pain in right knee: Secondary | ICD-10-CM | POA: Diagnosis not present

## 2019-11-25 DIAGNOSIS — J45909 Unspecified asthma, uncomplicated: Secondary | ICD-10-CM | POA: Diagnosis not present

## 2019-11-25 DIAGNOSIS — M170 Bilateral primary osteoarthritis of knee: Secondary | ICD-10-CM | POA: Diagnosis not present

## 2019-11-25 DIAGNOSIS — I10 Essential (primary) hypertension: Secondary | ICD-10-CM | POA: Diagnosis not present

## 2019-11-25 DIAGNOSIS — G894 Chronic pain syndrome: Secondary | ICD-10-CM | POA: Diagnosis not present

## 2019-11-25 DIAGNOSIS — K219 Gastro-esophageal reflux disease without esophagitis: Secondary | ICD-10-CM | POA: Diagnosis not present

## 2019-11-25 DIAGNOSIS — M1712 Unilateral primary osteoarthritis, left knee: Secondary | ICD-10-CM | POA: Diagnosis not present

## 2019-11-25 DIAGNOSIS — M25562 Pain in left knee: Secondary | ICD-10-CM | POA: Diagnosis not present

## 2020-01-27 DIAGNOSIS — D509 Iron deficiency anemia, unspecified: Secondary | ICD-10-CM | POA: Diagnosis not present

## 2021-03-01 IMAGING — CR CHEST - 2 VIEW
2 series · 2 of 2 positions shown · non-contrast
Comparison: None.

CLINICAL DATA: Central chest pain

EXAM:
CHEST - 2 VIEW

[chest ap]
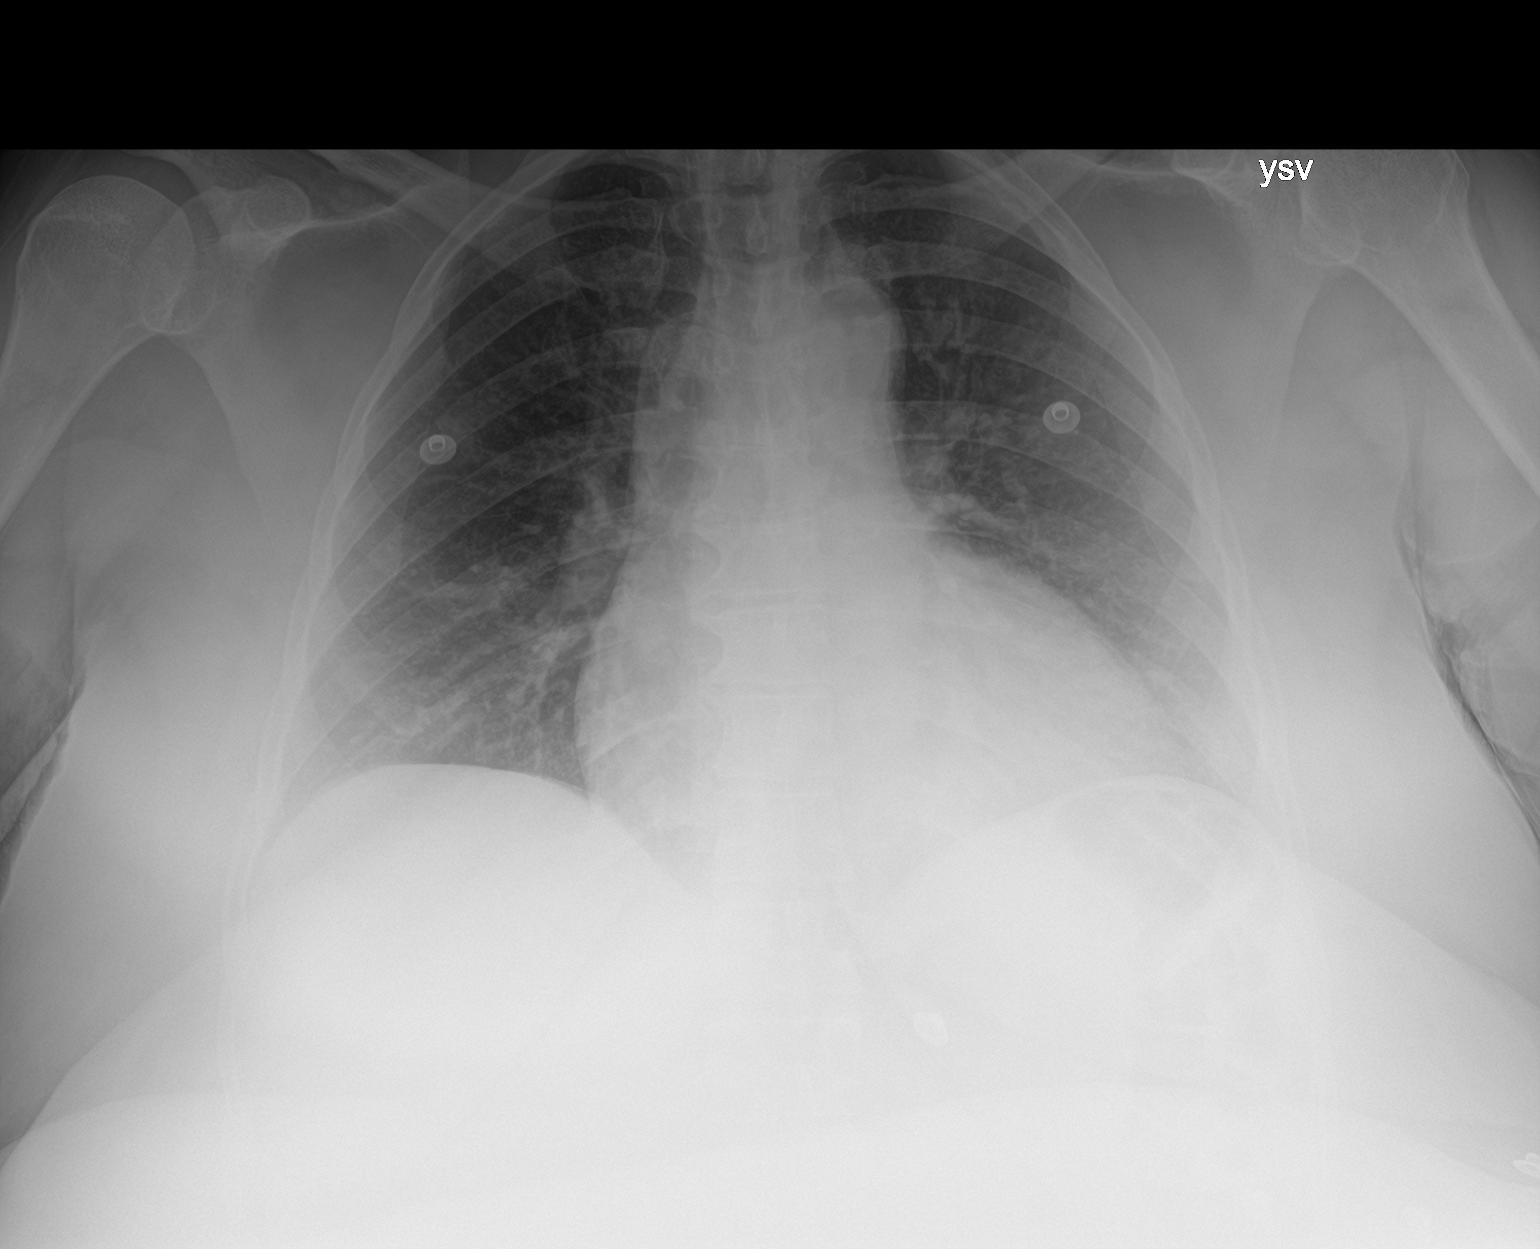

[chest lat]
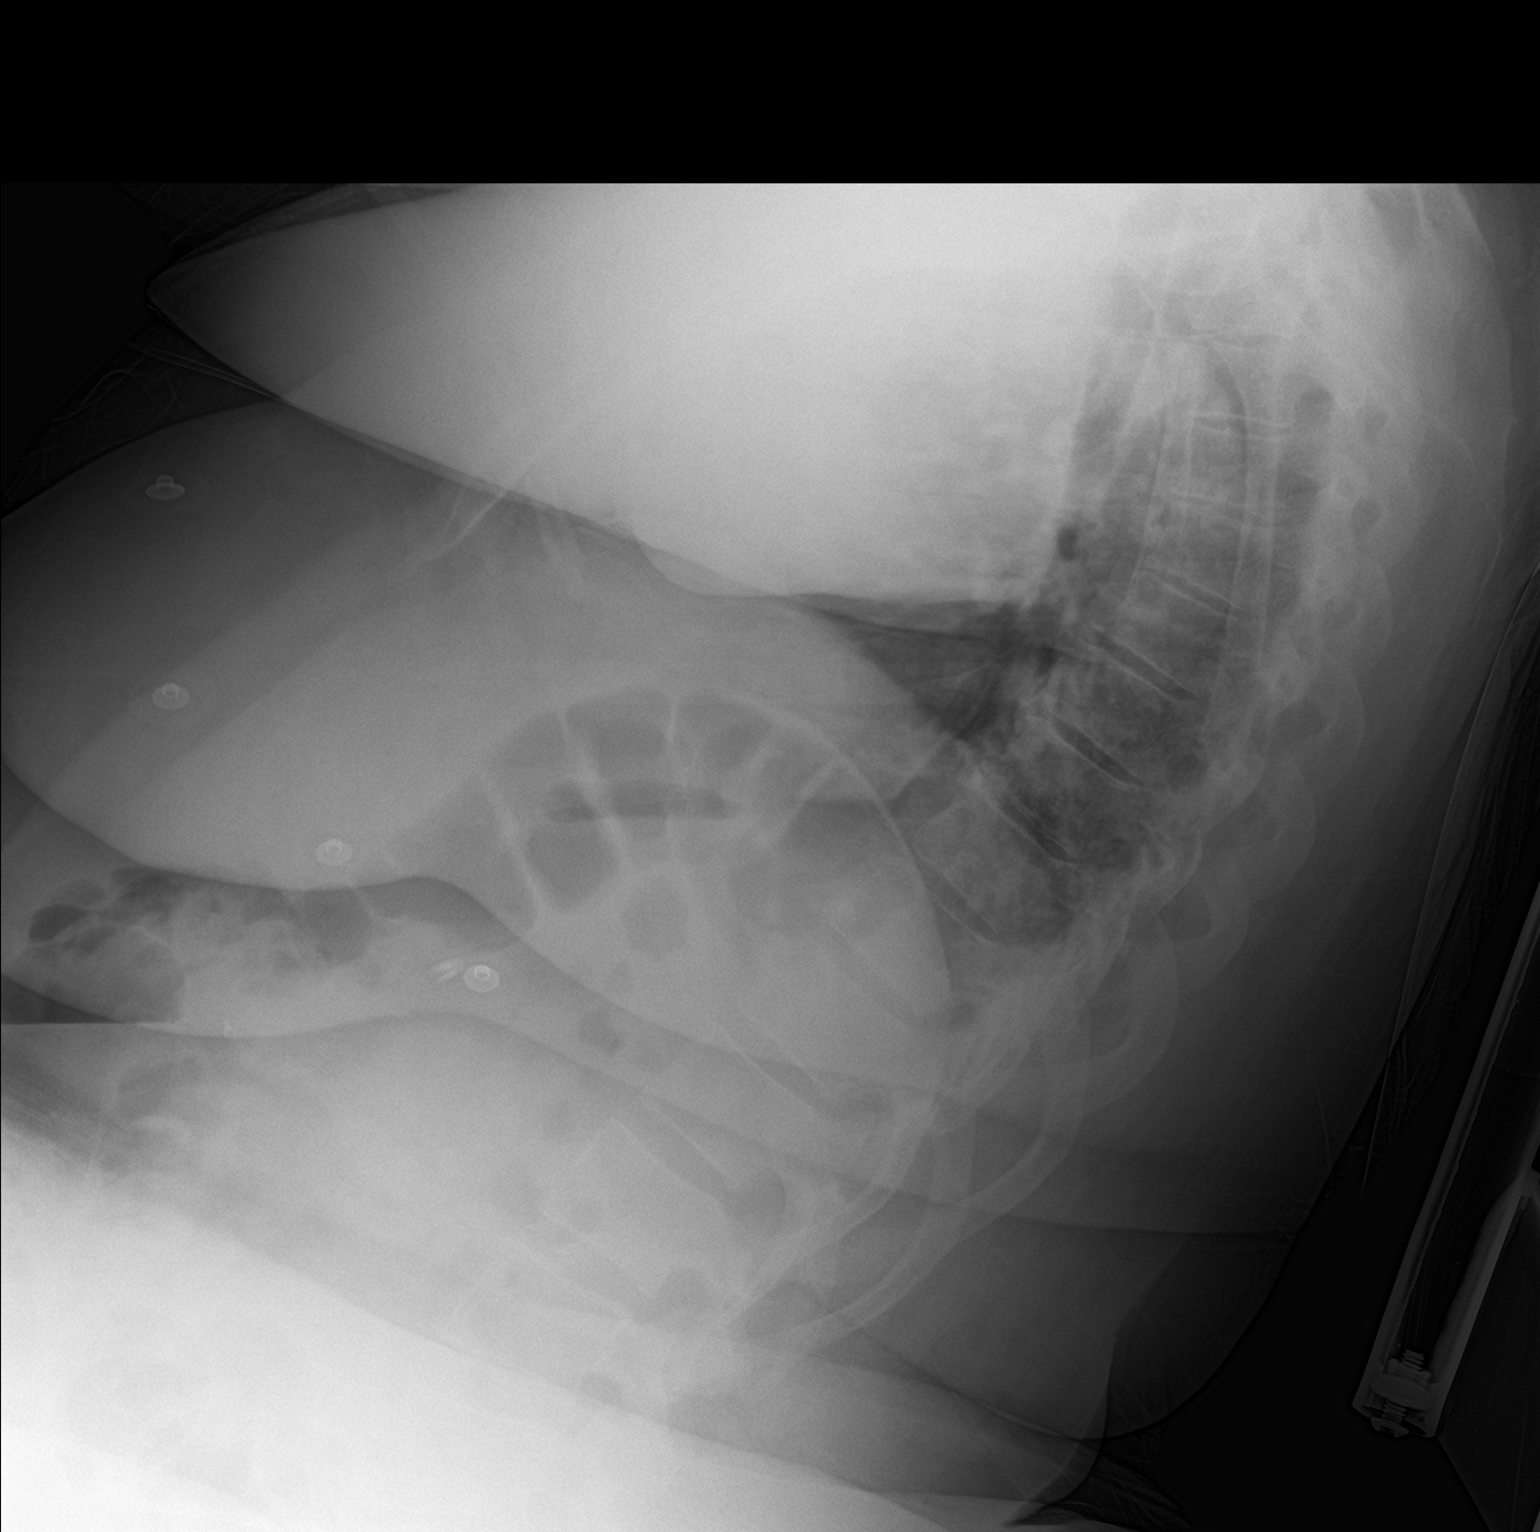

[2 of 2 positions shown; findings below may reference images not displayed]

FINDINGS: Cardiomegaly. Both lungs are clear. The visualized skeletal
structures are unremarkable.
IMPRESSION: Cardiomegaly without acute abnormality of the lungs.

## 2021-03-02 IMAGING — CT CT ABDOMEN AND PELVIS WITH CONTRAST
3 of 11 series · 12 of 46 positions shown, 18 images · IV contrast (omnipaque)
Comparison: None.

CLINICAL DATA: Chest pain for 2 days with abdominal pain

EXAM:
CT ANGIOGRAPHY CHEST
CT ABDOMEN AND PELVIS WITH CONTRAST
TECHNIQUE: Multidetector CT imaging of the chest was performed using the
standard protocol during bolus administration of intravenous
contrast. Multiplanar CT image reconstructions and MIPs were
obtained to evaluate the vascular anatomy. Multidetector CT imaging
of the abdomen and pelvis was performed using the standard protocol
during bolus administration of intravenous contrast.
CONTRAST:  125mL OMNIPAQUE IOHEXOL 350 MG/ML SOLN

[Series 7: pe thins · axial · 0.88mm/px · z∈[+1160,+1303]mm · 6 of 251 slices shown]
[im 18/251  soft-tissue]
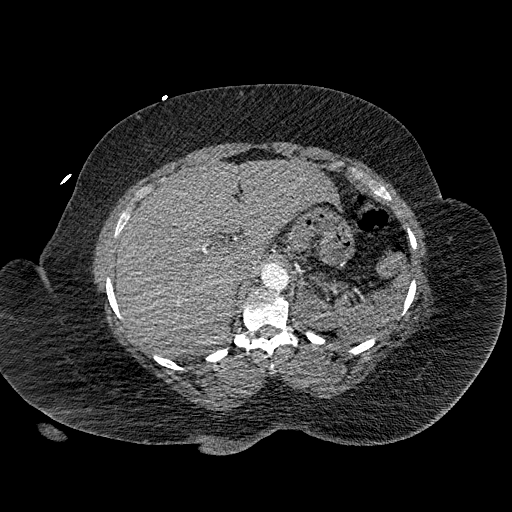
[im 54/251  soft-tissue]
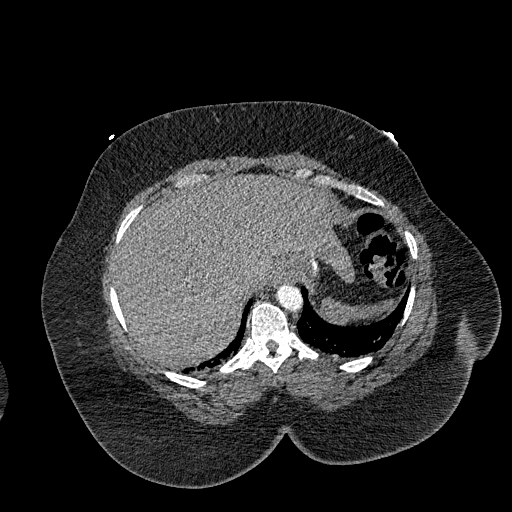
[im 90/251  soft-tissue]
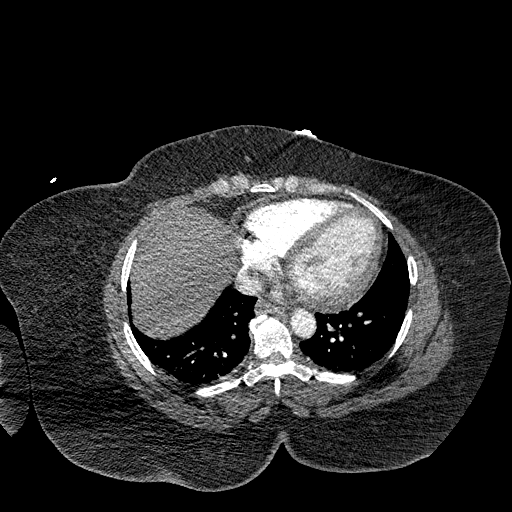
[im 108/251  soft-tissue]
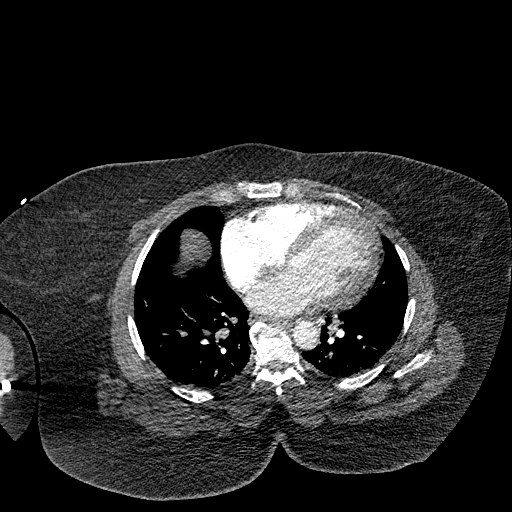
[im 143/251  soft-tissue]
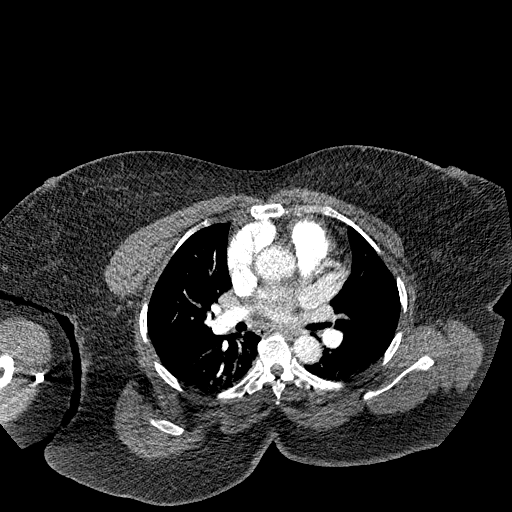
[im 161/251  soft-tissue]
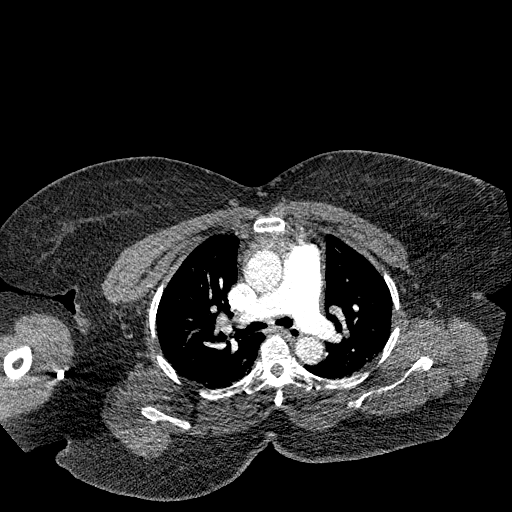

[Series 12: a/p w/ 5mm · axial · 0.98mm/px · z∈[+906,+1181]mm · 4 of 93 slices shown, 9 images]
[im 19/93  soft-tissue]
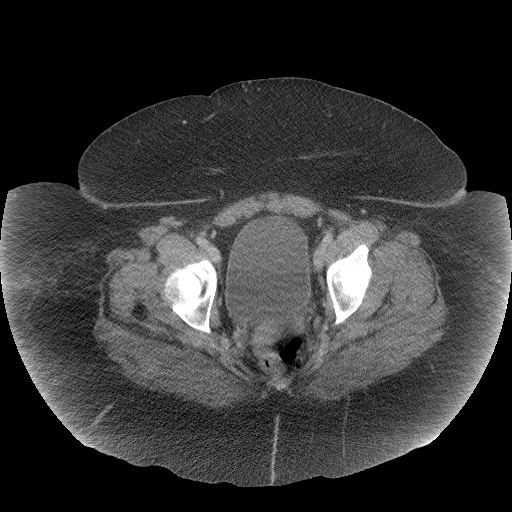
[im 19/93  lung]
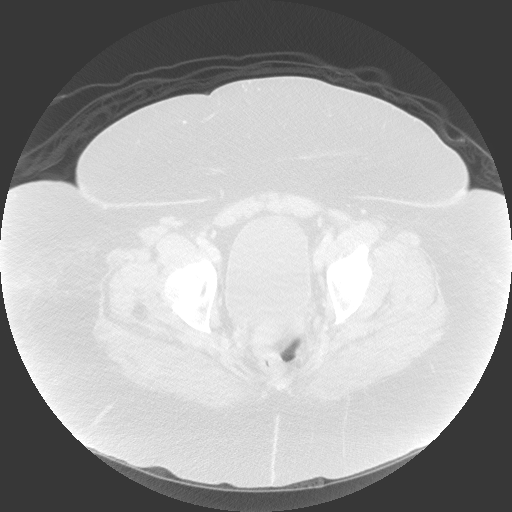
[im 19/93  bone]
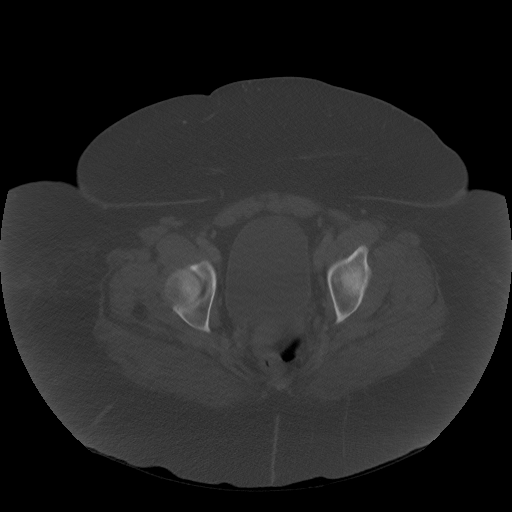
[im 37/93  soft-tissue]
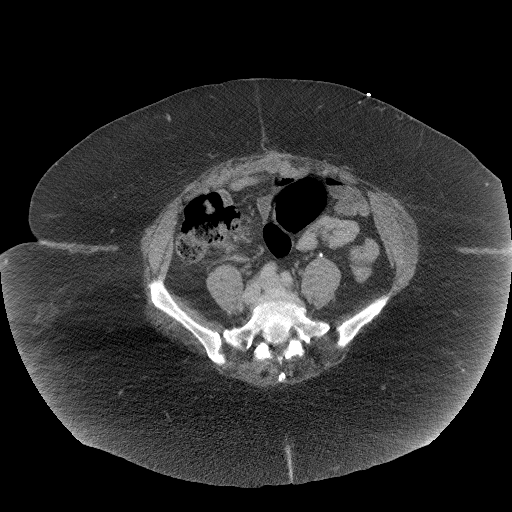
[im 37/93  lung]
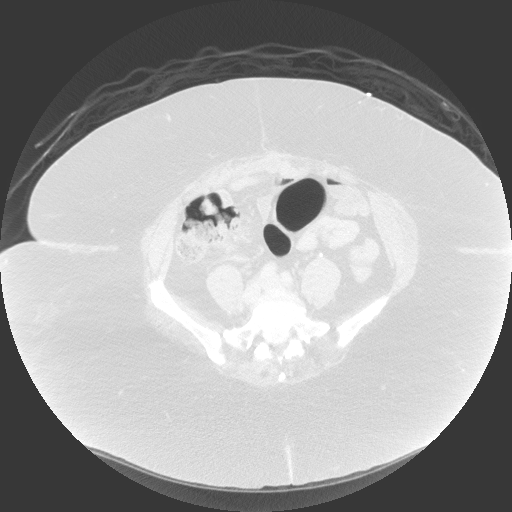
[im 56/93  soft-tissue]
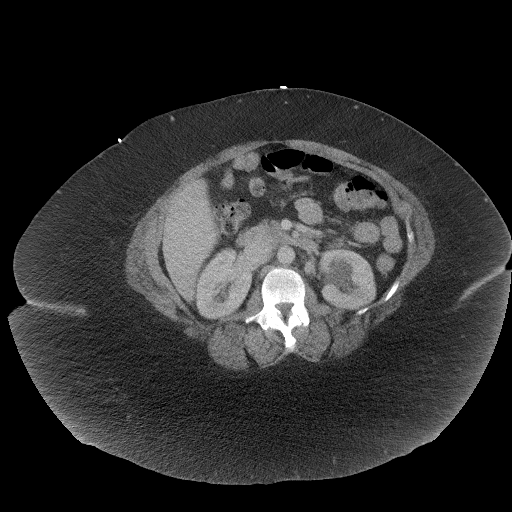
[im 56/93  lung]
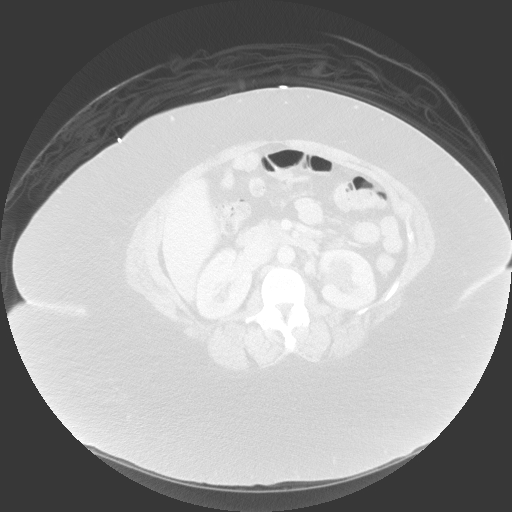
[im 74/93  soft-tissue]
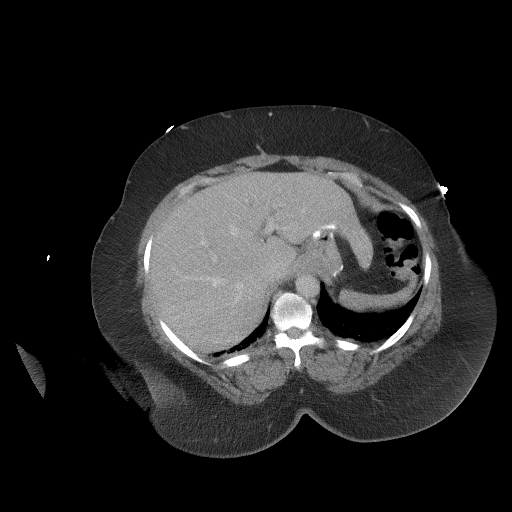
[im 74/93  lung]
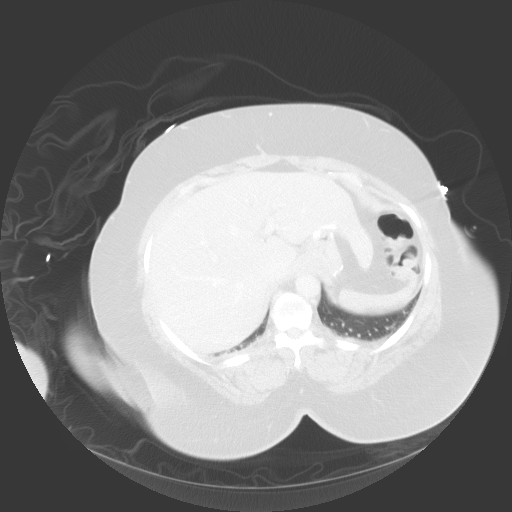

[Series 15: a/p w/ cor · coronal · 0.94mm/px · 2 of 151 slices shown, 3 images]
[im 51/151  soft-tissue]
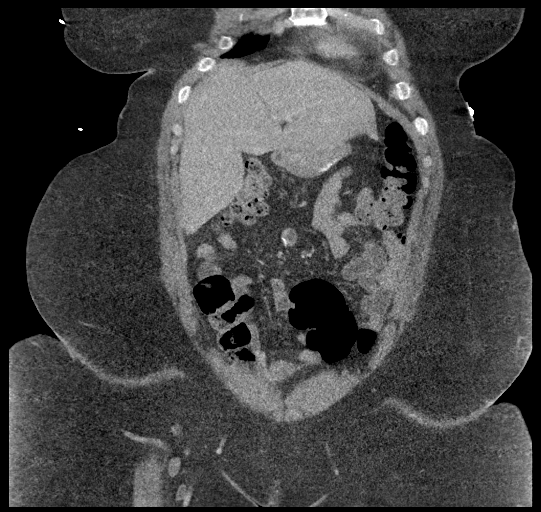
[im 51/151  bone]
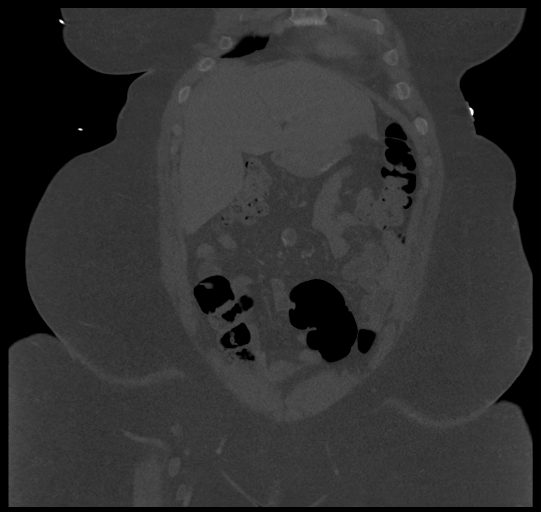
[im 101/151  soft-tissue]
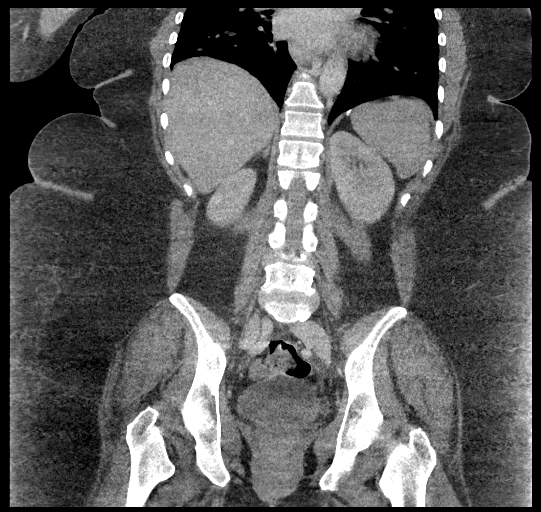

[12 of 46 positions shown; findings below may reference images not displayed]

FINDINGS: CTA CHEST FINDINGS

Cardiovascular: Satisfactory opacification of the pulmonary arteries
to the segmental level. No evidence of pulmonary embolism. Normal
heart size. No pericardial effusion.

Mediastinum/Nodes: Negative for adenopathy or mass.

Lungs/Pleura: Mild dependent atelectasis. There is no edema,
consolidation, effusion, or pneumothorax.

Musculoskeletal: No acute or aggressive finding

Review of the MIP images confirms the above findings.

CT ABDOMEN and PELVIS FINDINGS

Hepatobiliary: No focal liver abnormality.Cholecystectomy

Pancreas: Unremarkable.

Spleen: Unremarkable.

Adrenals/Urinary Tract: Negative adrenals. No hydronephrosis or
stone. Renal sinus cysts. Unremarkable bladder.

Stomach/Bowel: Weight loss surgery. No obstruction. No appendicitis.

Vascular/Lymphatic: No acute vascular abnormality. No mass or
adenopathy.

Reproductive:Hysterectomy.

Other: No ascites or pneumoperitoneum.

Musculoskeletal: No acute abnormalities. Severe lower lumbar facet
arthropathy. Bilateral sacroiliac osteoarthritis.

Review of the MIP images confirms the above findings.
IMPRESSION: No emergent finding in the chest or abdomen.

## 2023-07-30 ENCOUNTER — Other Ambulatory Visit: Payer: Self-pay

## 2023-07-31 ENCOUNTER — Inpatient Hospital Stay: Payer: 59

## 2023-07-31 ENCOUNTER — Inpatient Hospital Stay: Payer: 59 | Attending: Oncology | Admitting: Oncology

## 2023-07-31 ENCOUNTER — Encounter: Payer: Self-pay | Admitting: Oncology

## 2023-07-31 VITALS — BP 134/73 | HR 64 | Temp 98.8°F | Resp 16 | Ht 62.0 in | Wt 377.7 lb

## 2023-07-31 DIAGNOSIS — M25561 Pain in right knee: Secondary | ICD-10-CM | POA: Insufficient documentation

## 2023-07-31 DIAGNOSIS — M25562 Pain in left knee: Secondary | ICD-10-CM | POA: Insufficient documentation

## 2023-07-31 DIAGNOSIS — Z9884 Bariatric surgery status: Secondary | ICD-10-CM | POA: Diagnosis not present

## 2023-07-31 DIAGNOSIS — D509 Iron deficiency anemia, unspecified: Secondary | ICD-10-CM | POA: Diagnosis not present

## 2023-07-31 DIAGNOSIS — G8929 Other chronic pain: Secondary | ICD-10-CM | POA: Insufficient documentation

## 2023-07-31 DIAGNOSIS — K921 Melena: Secondary | ICD-10-CM | POA: Insufficient documentation

## 2023-07-31 DIAGNOSIS — N189 Chronic kidney disease, unspecified: Secondary | ICD-10-CM | POA: Diagnosis not present

## 2023-07-31 DIAGNOSIS — R768 Other specified abnormal immunological findings in serum: Secondary | ICD-10-CM | POA: Insufficient documentation

## 2023-07-31 DIAGNOSIS — R202 Paresthesia of skin: Secondary | ICD-10-CM | POA: Insufficient documentation

## 2023-07-31 DIAGNOSIS — J45909 Unspecified asthma, uncomplicated: Secondary | ICD-10-CM | POA: Insufficient documentation

## 2023-07-31 DIAGNOSIS — I129 Hypertensive chronic kidney disease with stage 1 through stage 4 chronic kidney disease, or unspecified chronic kidney disease: Secondary | ICD-10-CM | POA: Insufficient documentation

## 2023-07-31 DIAGNOSIS — D75839 Thrombocytosis, unspecified: Secondary | ICD-10-CM | POA: Insufficient documentation

## 2023-07-31 LAB — CBC WITH DIFFERENTIAL (CANCER CENTER ONLY)
Abs Immature Granulocytes: 0.02 10*3/uL (ref 0.00–0.07)
Basophils Absolute: 0 10*3/uL (ref 0.0–0.1)
Basophils Relative: 1 %
Eosinophils Absolute: 0.1 10*3/uL (ref 0.0–0.5)
Eosinophils Relative: 1 %
HCT: 37.2 % (ref 36.0–46.0)
Hemoglobin: 12.4 g/dL (ref 12.0–15.0)
Immature Granulocytes: 0 %
Lymphocytes Relative: 25 %
Lymphs Abs: 2.2 10*3/uL (ref 0.7–4.0)
MCH: 26.2 pg (ref 26.0–34.0)
MCHC: 33.3 g/dL (ref 30.0–36.0)
MCV: 78.5 fL — ABNORMAL LOW (ref 80.0–100.0)
Monocytes Absolute: 0.6 10*3/uL (ref 0.1–1.0)
Monocytes Relative: 7 %
Neutro Abs: 5.6 10*3/uL (ref 1.7–7.7)
Neutrophils Relative %: 66 %
Platelet Count: 468 10*3/uL — ABNORMAL HIGH (ref 150–400)
RBC: 4.74 MIL/uL (ref 3.87–5.11)
RDW: 18.4 % — ABNORMAL HIGH (ref 11.5–15.5)
WBC Count: 8.5 10*3/uL (ref 4.0–10.5)
nRBC: 0 % (ref 0.0–0.2)
nRBC: 0 /100{WBCs}

## 2023-07-31 LAB — FERRITIN: Ferritin: 153 ng/mL (ref 11–307)

## 2023-07-31 LAB — COMPREHENSIVE METABOLIC PANEL
ALT: 23 U/L (ref 0–44)
AST: 29 U/L (ref 15–41)
Albumin: 4.6 g/dL (ref 3.5–5.0)
Alkaline Phosphatase: 94 U/L (ref 38–126)
Anion gap: 13 (ref 5–15)
BUN: 35 mg/dL — ABNORMAL HIGH (ref 6–20)
CO2: 24 mmol/L (ref 22–32)
Calcium: 9.7 mg/dL (ref 8.9–10.3)
Chloride: 102 mmol/L (ref 98–111)
Creatinine, Ser: 1.47 mg/dL — ABNORMAL HIGH (ref 0.44–1.00)
GFR, Estimated: 41 mL/min — ABNORMAL LOW (ref >60)
Glucose, Bld: 90 mg/dL (ref 70–99)
Potassium: 4.3 mmol/L (ref 3.5–5.1)
Sodium: 139 mmol/L (ref 135–145)
Total Bilirubin: 0.4 mg/dL (ref 0.0–1.2)
Total Protein: 8.8 g/dL — ABNORMAL HIGH (ref 6.5–8.1)

## 2023-07-31 LAB — FOLATE: Folate: 6 ng/mL (ref >5.9)

## 2023-07-31 LAB — IRON AND TIBC
Iron: 57 ug/dL (ref 28–170)
Saturation Ratios: 14 % (ref 10.4–31.8)
TIBC: 399 ug/dL (ref 250–450)
UIBC: 342 ug/dL

## 2023-07-31 LAB — VITAMIN D 25 HYDROXY (VIT D DEFICIENCY, FRACTURES): Vit D, 25-Hydroxy: 11.75 ng/mL — ABNORMAL LOW (ref 30–100)

## 2023-07-31 LAB — VITAMIN B12: Vitamin B-12: 365 pg/mL (ref 180–914)

## 2023-07-31 NOTE — Progress Notes (Signed)
 CONSULT NOTE  Patient Care Team: Marelyn Quill, MD as PCP - General (Family Medicine)  CHIEF COMPLAINTS/PURPOSE OF CONSULTATION:  Abnormal immunoglobulin  HISTORY OF PRESENTING ILLNESS:  Jenna Harper 57 y.o. female is here because of abnormal serum protein, beta-2 globulin and gammaglobulin levels drawn by PCP on 07/22/2023.  It appears she had normal labs drawn on 07/16/2023 and found to have an elevated globulin level prompting additional testing.  Labs from 07/22/2023 reveal normal serum protein electrophoresis pattern without evidence of an M spike. UPEP 24 hour urine did not reveal evidence of abnormal protein bands (bence-Jones proteinuria) detected. Kappa Free light schain elevated 52.2, normal Kappa/Lamda LC and elevated KLLC ratio at 2.60.  She has some underlying CKD with baseline creatinine between 1.2-1.5 and BUN around 26.  eGFR is 52.  Total protein elevated 8.7.  Globulin level is elevated at 4.2.  Liver enzymes were WNL. Hemoglobin is 11.9, MCV 79.1. Normal WBC. Platelet count elevated 479. Sed rate elevated 48. ANCA screen negative.  Jenna Harper is a 57 year old female with past medical history significant for migraine without aura, obesity, fatty liver disease, vertigo, hypertension, asthma, degeneration of lumbar intervertebral disc and osteoarthritis of knees.  States she thinks she is iron deficient but has not been taking iron supplements.  Her past surgical history is significant for partial hysterectomy, cholecystectomy and gastric bypass for obesity (2017).  She has past family history significant for hypertension, heart disease and diabetes type 2.  Reports that one of her sisters had bone cancer about 15 years ago and is still living.  Denies any history of blood cancers.  Reports a significant family history of rheumatoid arthritis.  Reports she has never been tested.  She is followed by the pain clinic for chronic lower back and knee pain and receives  oxycodone 10 to 15 mg 4 times daily.  She reports a fair appetite without any weight loss.  She has been prescribed Ozempic although she has not received this yet.  Denies any drenching night sweats or any new bone pain.  She has chronic shortness of breath secondary to obesity and asthma.  Reports intermittent bright red blood per rectum that happens every few weeks.  Denies history of hemorrhoids.  Reports constipation and straining at times.  Reports history of a colonoscopy about a year ago that was normal.  Denies any melena or hematochezia.  Reports occasional cramping with bowel movements.  She is postmenopausal and does not have cycles any longer.  Reports chronic joint pain in her knees and tingling in her hands and feet.  She denies the use of NSAIDs or antiplatelet agent such as aspirin.  She has taken iron supplements in the past but they have caused constipation.  Reports she lives at home with her boyfriend.  She had 2 children but 1 passed away.  MEDICAL HISTORY:  Past Medical History:  Diagnosis Date   Asthma    f/b Dr. Kozlow   Bilateral primary osteoarthritis of knee    Cholelithiasis without cholecystitis    Chronic back pain    f/b [facility]   Hypertension    Migraine    Morbid obesity (HCC)    Reflux    Seasonal allergies     SURGICAL HISTORY: Past Surgical History:  Procedure Laterality Date   BACK SURGERY     low disc    gastrectomy sleeve     bariatric   PARTIAL HYSTERECTOMY     TUBAL LIGATION  SOCIAL HISTORY: Social History   Socioeconomic History   Marital status: Single    Spouse name: Not on file   Number of children: Not on file   Years of education: Not on file   Highest education level: Not on file  Occupational History   Not on file  Tobacco Use   Smoking status: Never   Smokeless tobacco: Never  Substance and Sexual Activity   Alcohol use: No    Alcohol/week: 0.0 standard drinks of alcohol   Drug use: Not on file    Sexual activity: Not on file  Other Topics Concern   Not on file  Social History Narrative   Not on file   Social Drivers of Health   Financial Resource Strain: Low Risk  (10/12/2021)   Received from [facility]   Overall Financial Resource Strain (CARDIA)    Difficulty of Paying Living Expenses: Not hard at all  Food Insecurity: No Food Insecurity (10/12/2021)   Received from [facility]   Hunger Vital Sign    Worried About Running Out of Food in the Last Year: Never true    Ran Out of Food in the Last Year: Never true  Transportation Needs: No Transportation Needs (10/01/2021)   Received from [facility] - Transportation    Lack of Transportation (Medical): No    Lack of Transportation (Non-Medical): No  Physical Activity: Insufficiently Active (10/12/2021)   Received from [facility]   Exercise Vital Sign    Days of Exercise per Week: 2 days    Minutes of Exercise per Session: 20 min  Stress: No Stress Concern Present (10/12/2021)   Received from [facility] - Occupational Stress Questionnaire    Feeling of Stress : Not at all  Social Connections: Unknown (10/15/2022)   Received from [facility]   Social Network    Social Network: Not on file  Intimate Partner Violence: Unknown (10/15/2022)   Received from [facility]   HITS    Physically Hurt: Not on file    Insult or Talk Down To: Not on file    Threaten Physical Harm: Not on file    Scream or Curse: Not on file    FAMILY HISTORY: Family History  Problem Relation Age of Onset   Diabetes Mother    Heart disease Father    Cancer Sister        bone    ALLERGIES:  is allergic to penicillins, shrimp [shellfish allergy], and vigamox [moxifloxacin].  MEDICATIONS:  Current Outpatient Medications  Medication Sig Dispense Refill   albuterol (PROVENTIL HFA;VENTOLIN HFA) 108 (90 Base) MCG/ACT inhaler Inhale 2 puffs into the lungs 3 (three) times daily as  needed for wheezing or shortness of breath.     ALPRAZolam (XANAX) 0.25 MG tablet Take 0.25-0.5 mg by mouth at bedtime as needed for anxiety.     amLODipine (NORVASC) 5 MG tablet Take 5 mg by mouth daily.     cephALEXin  (KEFLEX ) 250 MG capsule Take 1 capsule (250 mg total) by mouth 4 (four) times daily. 28 capsule 0   famotidine (PEPCID) 40 MG tablet take 1 (one) Tablet by mouth daily before bed     Fluticasone-Umeclidin-Vilant (TRELEGY ELLIPTA) 200-62.5-25 MCG/ACT AEPB Take one puff once daily, rinse mouth after use     gabapentin (NEURONTIN) 600 MG tablet Take 600 mg by mouth 3 (three) times daily.     gentamicin (GARAMYCIN) 0.3 % ophthalmic solution Administer 2  drops into the right eye every 4 hours. for 7 days     hydrochlorothiazide (HYDRODIURIL) 25 MG tablet Take 25 mg by mouth daily.     ibuprofen (IBU) 600 MG tablet Take 1 tablet by mouth every 6 (six) hours as needed.     indomethacin (INDOCIN) 50 MG capsule Take 1 capsule by mouth every 8 hours as needed for acute gout pain     lidocaine (LIDODERM) 5 % APPLY ONE PATCH TOPICALLY TO CLEAN, DRY SKIN. LEAVE ON FOR 12 HOURS THEN REMOVE. MUST WAIT AT LEAST 12 HOURS BEFORE APPLYING PATCH(ES) AGAIN.     metoprolol succinate (TOPROL-XL) 50 MG 24 hr tablet Take 50 mg by mouth daily. Take with or immediately following a meal.     nystatin cream (MYCOSTATIN) Apply topically.     oxyCODONE (ROXICODONE) 15 MG immediate release tablet Take 15 mg by mouth every 4 (four) hours as needed for pain (every 4-6 hrs prn).     oxyCODONE-acetaminophen (PERCOCET/ROXICET) 5-325 MG tablet      promethazine  (PHENERGAN ) 25 MG tablet Take by mouth.     QUEtiapine (SEROQUEL) 100 MG tablet Take 1 tablet by mouth at bedtime.     Semaglutide-Weight Management (WEGOVY) 0.25 MG/0.5ML SOAJ Inject by subcutaneous route for 112 days.     traMADol (ULTRAM) 50 MG tablet Take by mouth every 6 (six) hours as needed.     triamcinolone  cream (KENALOG ) 0.1 % Apply topically.      venlafaxine XR (EFFEXOR-XR) 150 MG 24 hr capsule Take 150 mg by mouth daily with breakfast.     No current facility-administered medications for this visit.    REVIEW OF SYSTEMS:   Review of Systems  Constitutional:  Positive for malaise/fatigue. Negative for fever and weight loss.  Respiratory:  Positive for shortness of breath (With exertion due to asthma).   Gastrointestinal:  Positive for abdominal pain, blood in stool and constipation. Negative for diarrhea, nausea and vomiting.  Genitourinary:  Negative for hematuria.  Musculoskeletal:  Positive for back pain, falls and joint pain.  Neurological:  Positive for sensory change and headaches (History of migraines).    PHYSICAL EXAMINATION: ECOG PERFORMANCE STATUS: 1 - Symptomatic but completely ambulatory  There were no vitals filed for this visit. There were no vitals filed for this visit.  Physical Exam Constitutional:      Appearance: Normal appearance. She is obese.  Cardiovascular:     Rate and Rhythm: Normal rate and regular rhythm.  Pulmonary:     Effort: Pulmonary effort is normal.     Breath sounds: Normal breath sounds.  Abdominal:     General: Bowel sounds are normal.     Palpations: Abdomen is soft.  Musculoskeletal:        General: No swelling. Normal range of motion.  Neurological:     Mental Status: She is alert and oriented to person, place, and time. Mental status is at baseline.     LABORATORY DATA:  I have reviewed the data as listed No results found for this or any previous visit (from the past 2160 hours).  RADIOGRAPHIC STUDIES: I have personally reviewed the radiological images as listed and agreed with the findings in the report. No results found.  ASSESSMENT & PLAN:  No problem-specific Assessment & Plan notes found for this encounter. 1. Elevated serum immunoglobulin free light chains (Primary) -Discovered incidentally by PCP during routine lab draw. -Labs from 07/16/2023 showed total  protein elevated 8.7 with globulin level at 4.2.  Hemoglobin  11.9, MCV 79.1.  Normal WBC.  Platelet count elevated 479.  Calcium level normal. -Labs from 07/22/2023 show normal serum protein electrophoresis without evidence of M spike. UPEP 24 hour urine did not reveal evidence of abnormal protein bands (bence-Jones proteinuria) detected. Kappa Free light chain elevated 52.2, normal Kappa/Lamda LC and elevated KLLC ratio at 2.60.   -Etiology likely secondary to CKD and possibly underlying autoimmune disorder.  Has family history of RA. -Recommend labs today and return to clinic in approximately 2 weeks to review results.  2.  Microcytic anemia -Per patient has personal history of iron deficiency. -Unable to tolerate oral iron secondary to GI upset. -She is also status post gastric bypass. -Will draw nutritional labs as well.  Hemoglobin was low at 11.9 during last lab draw. -Return to clinic in 2 weeks for follow-up and to review results.  3. Chronic kidney disease, unspecified CKD stage -Has history of CKD. -Creatinine baseline between 1.2-1.5 with a BUN of 26. -She was previously followed by Dr. Niels at [facility]. -She was last seen by him on 01/24/2022.  Reports she missed her last appointment but will follow-up.  4. Thrombocytosis -Likely reactive secondary to iron deficiency. -Will draw nutritional labs and evaluate further.  5.  Blood in stool -Could be due to hemorrhoids but occasionally will be a lot. -Based on labs from today, may need to be referred back to gastroenterology if this continues.   All questions were answered. The patient knows to call the clinic with any problems, questions or concerns. I spent 30 minutes counseling the patient face to face. The total time spent in the appointment was 30 minutes and more than 50% was on counseling.     Delon FORBES Hope, NP 07/31/23 9:02 AM

## 2023-08-01 LAB — RHEUMATOID FACTOR: Rheumatoid fact SerPl-aCnc: 11.8 [IU]/mL (ref <14.0)

## 2023-08-02 LAB — ANA: Anti Nuclear Antibody (ANA): NEGATIVE

## 2023-08-02 LAB — COPPER, SERUM: Copper: 99 ug/dL (ref 80–158)

## 2023-08-13 ENCOUNTER — Other Ambulatory Visit: Payer: Self-pay | Admitting: Oncology

## 2023-08-13 DIAGNOSIS — D509 Iron deficiency anemia, unspecified: Secondary | ICD-10-CM

## 2023-08-13 NOTE — Progress Notes (Signed)
 [facility]  [address] [place],  [place]  [address] [phone]  Clinic Day:  08/14/2023  Referring physician: Marelyn Axon, MD   HISTORY OF PRESENT ILLNESS:  The patient is a 57 y.o. female who our office recently saw for microcytic anemia and an SPEP that was abnormal, but did not show a monoclonal spike.  He comes in today to go over all of his recent labs and the implications.  Overall, the patient claims to be doing fairly well.  She denies having fatigue, blood loss, or other systemic symptoms which concern her for an underlying hematologic disorder being present.  PHYSICAL EXAM:  Blood pressure (!) 150/83, pulse 96, temperature 98.2 F (36.8 C), temperature source Oral, resp. rate 16, height 5' 2 (1.575 m), weight (!) 378 lb (171.5 kg), SpO2 97%. Wt Readings from Last 3 Encounters:  08/14/23 (!) 378 lb (171.5 kg)  07/31/23 (!) 377 lb 11.2 oz (171.3 kg)  02/26/19 285 lb (129.3 kg)   Body mass index is 69.14 kg/m. Performance status (ECOG): 1 - Symptomatic but completely ambulatory Physical Exam Constitutional:      Appearance: Normal appearance. She is obese. She is not ill-appearing.     Comments: She is ambulating with a walker  HENT:     Mouth/Throat:     Mouth: Mucous membranes are moist.     Pharynx: Oropharynx is clear. No oropharyngeal exudate or posterior oropharyngeal erythema.  Cardiovascular:     Rate and Rhythm: Normal rate and regular rhythm.     Heart sounds: No murmur heard.    No friction rub. No gallop.  Pulmonary:     Effort: Pulmonary effort is normal. No respiratory distress.     Breath sounds: Normal breath sounds. No wheezing, rhonchi or rales.  Abdominal:     General: Bowel sounds are normal. There is no distension.     Palpations: Abdomen is soft. There is no mass.     Tenderness: There is no abdominal tenderness.  Musculoskeletal:        General: No swelling.     Right lower leg: No edema.     Left  lower leg: No edema.  Lymphadenopathy:     Cervical: No cervical adenopathy.     Upper Body:     Right upper body: No supraclavicular or axillary adenopathy.     Left upper body: No supraclavicular or axillary adenopathy.     Lower Body: No right inguinal adenopathy. No left inguinal adenopathy.  Skin:    General: Skin is warm.     Coloration: Skin is not jaundiced.     Findings: No lesion or rash.  Neurological:     General: No focal deficit present.     Mental Status: She is alert and oriented to person, place, and time. Mental status is at baseline.  Psychiatric:        Mood and Affect: Mood normal.        Behavior: Behavior normal.        Thought Content: Thought content normal.     LABS:      Latest Ref Rng & Units 07/31/2023   11:26 AM 02/26/2019    9:06 PM  CBC  WBC 4.0 - 10.5 K/uL 8.5  6.6   Hemoglobin 12.0 - 15.0 g/dL 87.5  88.9   Hematocrit 36.0 - 46.0 % 37.2  35.1   Platelets 150 - 400 K/uL 468  441       Latest Ref  Rng & Units 07/31/2023   11:26 AM 02/27/2019    4:05 AM 02/26/2019    9:06 PM  CMP  Glucose 70 - 99 mg/dL 90   898   BUN 6 - 20 mg/dL 35   25   Creatinine 9.55 - 1.00 mg/dL 8.52   8.82   Sodium 864 - 145 mmol/L 139   139   Potassium 3.5 - 5.1 mmol/L 4.3   3.8   Chloride 98 - 111 mmol/L 102   105   CO2 22 - 32 mmol/L 24   22   Calcium 8.9 - 10.3 mg/dL 9.7   9.1   Total Protein 6.5 - 8.1 g/dL 8.8  8.1    Total Bilirubin 0.0 - 1.2 mg/dL 0.4  0.7    Alkaline Phos 38 - 126 U/L 94  86    AST 15 - 41 U/L 29  20    ALT 0 - 44 U/L 23  13      Latest Reference Range & Units 07/31/23 11:26  MCV 80.0 - 100.0 fL 78.5 (L)  (L): Data is abnormally low  Latest Reference Range & Units 07/31/23 11:26 07/31/23 11:27  Iron 28 - 170 ug/dL  57  UIBC ug/dL  657  TIBC 749 - 549 ug/dL  600  Saturation Ratios 10.4 - 31.8 %  14  Ferritin 11 - 307 ng/mL 153   Folate >5.9 ng/mL 6.0   Copper  80 - 158 ug/dL 99   Vitamin D , 25-Hydroxy 30 - 100 ng/mL 11.75 (L)    Vitamin B12 180 - 914 pg/mL 365   (L): Data is abnormally low  ASSESSMENT & PLAN:  Assessment/Plan:  A 58 y.o. female with a history of microcytic anemia and an elevated protein level.  In clinic today, I reviewed all of her recent labs with her.  Despite having a low MCV, the patient's iron parameters are all normal.  Furthermore, her hemoglobin of 12.4 is normal.  Based upon these labs, I do not believe any form of iron intervention is necessary at this time.  However, her iron parameters and CBC will continue to be followed over time.  Despite having an elevated total protein, her serum protein electrophoresis fortunately not reveal an M-spike to suggest an underlying plasma cell dyscrasia, such as multiple myeloma, is present.  As it stands currently, I do not see any serious hematologic issues which should impact this patient's life in the foreseeable future.  I will see her back in 6 months, primarily to ensure iron deficiency anemia has not gradually developed over time.  The patient understands all the plans discussed today and is in agreement with them.    Monti Villers DELENA Kerns, MD

## 2023-08-14 ENCOUNTER — Other Ambulatory Visit: Payer: Self-pay | Admitting: Oncology

## 2023-08-14 ENCOUNTER — Inpatient Hospital Stay: Payer: 59 | Attending: Oncology | Admitting: Oncology

## 2023-08-14 ENCOUNTER — Ambulatory Visit: Payer: 59 | Admitting: Oncology

## 2023-08-14 VITALS — BP 150/83 | HR 96 | Temp 98.2°F | Resp 16 | Ht 62.0 in | Wt 378.0 lb

## 2023-08-14 DIAGNOSIS — R768 Other specified abnormal immunological findings in serum: Secondary | ICD-10-CM | POA: Diagnosis not present

## 2023-08-14 DIAGNOSIS — R718 Other abnormality of red blood cells: Secondary | ICD-10-CM | POA: Diagnosis not present

## 2023-08-14 DIAGNOSIS — D509 Iron deficiency anemia, unspecified: Secondary | ICD-10-CM | POA: Insufficient documentation

## 2023-09-04 ENCOUNTER — Ambulatory Visit (HOSPITAL_BASED_OUTPATIENT_CLINIC_OR_DEPARTMENT_OTHER)
Admission: EM | Admit: 2023-09-04 | Discharge: 2023-09-04 | Disposition: A | Discharge status: Home / Self Care | Payer: 59

## 2023-09-04 ENCOUNTER — Encounter (HOSPITAL_BASED_OUTPATIENT_CLINIC_OR_DEPARTMENT_OTHER): Payer: Self-pay | Admitting: Emergency Medicine

## 2023-09-04 ENCOUNTER — Other Ambulatory Visit (HOSPITAL_BASED_OUTPATIENT_CLINIC_OR_DEPARTMENT_OTHER): Payer: Self-pay

## 2023-09-04 DIAGNOSIS — R112 Nausea with vomiting, unspecified: Secondary | ICD-10-CM

## 2023-09-04 DIAGNOSIS — A084 Viral intestinal infection, unspecified: Secondary | ICD-10-CM | POA: Diagnosis not present

## 2023-09-04 MED ORDER — ONDANSETRON 4 MG PO TBDP
4.0000 mg | ORAL_TABLET | Freq: Three times a day (TID) | ORAL | 0 refills | Status: AC | PRN
  Filled 2023-09-04: qty 20, 7d supply, fill #0

## 2023-09-04 NOTE — Discharge Instructions (Addendum)
 Exam and history are consistent with viral gastroenteritis.  Provided education about dehydration and rehydration.  Ondansetron , ODT, 4 mg, every 8 hours as needed for nausea and vomiting.  Follow-up here if symptoms do not improve, worsen or new symptoms occur.  Reviewed signs and symptoms of dehydration (severe fatigue, moderate body aches, dry mouth, failure to urinate regularly, dark concentrated urine) and reasons to go to an emergency room.

## 2023-09-04 NOTE — ED Provider Notes (Signed)
 [facility]    CSN: [id number] Arrival date & time: 09/04/23  1001      History   Chief Complaint No chief complaint on file.   HPI Jenna Harper is a 58 y.o. female.   Patient reports acute onset nausea, vomiting, diarrhea and fatigue.  It started yesterday.  She has had 1 loose stool today and nausea but has not eaten so she has not vomited.  Yesterday she had 3-5 stools.  And she had vomiting several times during the day.     Past Medical History:  Diagnosis Date   Asthma    f/b Dr. Kozlow   Bilateral primary osteoarthritis of knee    Cholelithiasis without cholecystitis    Chronic back pain    f/b [facility]   Hypertension    Migraine    Morbid obesity (HCC)    Reflux    Seasonal allergies     Patient Active Problem List   Diagnosis Date Noted   Microcytosis 08/14/2023   Hypertension 09/19/2016   Morbid obesity (HCC) 09/19/2016    Past Surgical History:  Procedure Laterality Date   BACK SURGERY     low disc    CHOLECYSTECTOMY     gastrectomy sleeve     bariatric   PARTIAL HYSTERECTOMY     TUBAL LIGATION      OB History   No obstetric history on file.      Home Medications    Prior to Admission medications   Medication Sig Start Date End Date Taking? Authorizing Provider  amLODipine (NORVASC) 5 MG tablet Take 5 mg by mouth daily.   Yes [provider]  famotidine (PEPCID) 40 MG tablet take 1 (one) Tablet by mouth daily before bed 05/17/22  Yes [provider]  gabapentin (NEURONTIN) 600 MG tablet Take 600 mg by mouth 3 (three) times daily.   Yes [provider]  hydrochlorothiazide (HYDRODIURIL) 25 MG tablet Take 25 mg by mouth daily.   Yes [provider]  ondansetron  (ZOFRAN -ODT) 4 MG disintegrating tablet Take 1 tablet (4 mg total) by mouth every 8 (eight) hours as needed for nausea or vomiting. 09/04/23  Yes Ival Domino, FNP  oxyCODONE (ROXICODONE) 15 MG immediate release  tablet Take 15 mg by mouth every 4 (four) hours as needed for pain (every 4-6 hrs prn).   Yes [provider]  QUEtiapine (SEROQUEL) 100 MG tablet Take 1 tablet by mouth at bedtime. 11/14/19  Yes [provider]  tiZANidine (ZANAFLEX) 2 MG tablet Take 4 mg by mouth daily. 09/03/23  Yes [provider]  albuterol (PROVENTIL HFA;VENTOLIN HFA) 108 (90 Base) MCG/ACT inhaler Inhale 2 puffs into the lungs 3 (three) times daily as needed for wheezing or shortness of breath.    [provider]  cephALEXin  (KEFLEX ) 250 MG capsule Take 1 capsule (250 mg total) by mouth 4 (four) times daily. 02/27/19   Ward, Josette SAILOR, DO  Fluticasone-Umeclidin-Vilant (TRELEGY ELLIPTA) 200-62.5-25 MCG/ACT AEPB Take one puff once daily, rinse mouth after use    [provider]  indomethacin (INDOCIN) 50 MG capsule Take 1 capsule by mouth every 8 hours as needed for acute gout pain 06/29/22   [provider]  metoprolol succinate (TOPROL-XL) 50 MG 24 hr tablet Take 50 mg by mouth daily. Take with or immediately following a meal.    [provider]  nystatin cream (MYCOSTATIN) Apply topically. 03/08/16   [provider]  promethazine  (PHENERGAN ) 25 MG tablet Take  by mouth. 09/20/15   [provider]  Semaglutide-Weight Management (WEGOVY) 0.25 MG/0.5ML SOAJ Inject by subcutaneous route for 112 days.    [provider]  traMADol (ULTRAM) 50 MG tablet Take by mouth every 6 (six) hours as needed.    [provider]    Family History Family History  Problem Relation Age of Onset   Diabetes Mother    Heart disease Father    Cancer Sister        bone    Social History Social History   Tobacco Use   Smoking status: Never   Smokeless tobacco: Never  Substance Use Topics   Alcohol use: No    Alcohol/week: 0.0 standard drinks of alcohol   Drug use: Never     Allergies   Penicillins, Shrimp [shellfish allergy], and Vigamox  [moxifloxacin]   Review of Systems Review of Systems  Constitutional:  Positive for fatigue. Negative for chills and fever.  HENT:  Negative for ear pain and sore throat.   Eyes:  Negative for pain and visual disturbance.  Respiratory:  Negative for cough and shortness of breath.   Cardiovascular:  Negative for chest pain and palpitations.  Gastrointestinal:  Positive for diarrhea, nausea and vomiting. Negative for abdominal pain and constipation.  Genitourinary:  Negative for dysuria and hematuria.  Musculoskeletal:  Negative for arthralgias and back pain.  Skin:  Negative for color change and rash.  Neurological:  Positive for weakness. Negative for seizures and syncope.  All other systems reviewed and are negative.    Physical Exam Triage Vital Signs ED Triage Vitals  Encounter Vitals Group     BP 09/04/23 1011 (!) 155/91     Systolic BP Percentile --      Diastolic BP Percentile --      Pulse Rate 09/04/23 1011 78     Resp 09/04/23 1011 (!) 22     Temp 09/04/23 1011 98.3 F (36.8 C)     Temp Source 09/04/23 1011 Oral     SpO2 09/04/23 1011 94 %     Weight --      Height --      Head Circumference --      Peak Flow --      Pain Score 09/04/23 1009 8     Pain Loc --      Pain Education --      Exclude from Growth Chart --    No data found.  Updated Vital Signs BP (!) 155/91 (BP Location: Right Arm)   Pulse 78   Temp 98.3 F (36.8 C) (Oral)   Resp (!) 22   SpO2 94%   Visual Acuity Right Eye Distance:   Left Eye Distance:   Bilateral Distance:    Right Eye Near:   Left Eye Near:    Bilateral Near:     Physical Exam Vitals and nursing note reviewed.  Constitutional:      General: She is not in acute distress.    Appearance: She is well-developed. She is ill-appearing. She is not toxic-appearing.  HENT:     Head: Normocephalic and atraumatic.     Right Ear: Hearing, tympanic membrane, ear canal and external ear normal.     Left Ear: Hearing, tympanic  membrane, ear canal and external ear normal.     Nose: Nose normal.     Mouth/Throat:     Lips: Pink.     Mouth: Mucous membranes are moist.     Pharynx: Uvula midline.  No oropharyngeal exudate or posterior oropharyngeal erythema.     Tonsils: No tonsillar exudate.  Eyes:     Conjunctiva/sclera: Conjunctivae normal.     Pupils: Pupils are equal, round, and reactive to light.  Cardiovascular:     Rate and Rhythm: Normal rate and regular rhythm.     Heart sounds: S1 normal and S2 normal. No murmur heard. Pulmonary:     Effort: Pulmonary effort is normal. No respiratory distress.     Breath sounds: Normal breath sounds. No decreased breath sounds, wheezing, rhonchi or rales.  Abdominal:     Palpations: Abdomen is soft.     Tenderness: There is generalized abdominal tenderness (Mild to moderate.).  Musculoskeletal:        General: No swelling.     Cervical back: Neck supple.  Lymphadenopathy:     Head:     Right side of head: No submental, submandibular, tonsillar, preauricular or posterior auricular adenopathy.     Left side of head: No submental, submandibular, tonsillar, preauricular or posterior auricular adenopathy.     Cervical: No cervical adenopathy.     Right cervical: No superficial cervical adenopathy.    Left cervical: No superficial cervical adenopathy.  Skin:    General: Skin is warm and dry.     Capillary Refill: Capillary refill takes less than 2 seconds.     Findings: No rash.  Neurological:     Mental Status: She is alert and oriented to person, place, and time.  Psychiatric:        Mood and Affect: Mood normal.      UC Treatments / Results  Labs (all labs ordered are listed, but only abnormal results are displayed) Labs Reviewed - No data to display  EKG   Radiology No results found.  Procedures Procedures (including critical care time)  Medications Ordered in UC Medications - No data to display  Initial Impression / Assessment and Plan / UC  Course  I have reviewed the triage vital signs and the nursing notes.  Pertinent labs & imaging results that were available during my care of the patient were reviewed by me and considered in my medical decision making (see chart for details).     Exam and history are consistent with viral gastroenteritis.  Ondansetron  ODT, 4 mg, every 8 hours, as needed for nausea and vomiting.  Provided education and discussed dehydration and rehydration.  Reviewed signs and symptoms of dehydration and reasons to go to an emergency room for IV fluids if needed.  Follow-up if symptoms do not improve, worsen or new symptoms occur. Final Clinical Impressions(s) / UC Diagnoses   Final diagnoses:  Nausea and vomiting, unspecified vomiting type  Viral gastroenteritis     Discharge Instructions      Exam and history are consistent with viral gastroenteritis.  Provided education about dehydration and rehydration.  Ondansetron , ODT, 4 mg, every 8 hours as needed for nausea and vomiting.  Follow-up here if symptoms do not improve, worsen or new symptoms occur.  Reviewed signs and symptoms of dehydration (severe fatigue, moderate body aches, dry mouth, failure to urinate regularly, dark concentrated urine) and reasons to go to an emergency room.     ED Prescriptions     Medication Sig Dispense Auth. Provider   ondansetron  (ZOFRAN -ODT) 4 MG disintegrating tablet Take 1 tablet (4 mg total) by mouth every 8 (eight) hours as needed for nausea or vomiting. 20 tablet Nadalee Neiswender, FNP      PDMP not reviewed this  encounter.   Ival Domino, FNP 09/04/23 1046

## 2023-09-04 NOTE — ED Triage Notes (Signed)
Pt c/o nausea, fatigue, diarrhea since yesterday.

## 2024-02-11 NOTE — Progress Notes (Deleted)
 [facility]  [address] [place],  [place]  [address] [phone]  Clinic Day:  02/11/2024  Referring physician: Marelyn Axon, MD   HISTORY OF PRESENT ILLNESS:  The patient is a 58 y.o. female who our office recently saw for microcytic anemia and an SPEP that was abnormal, but did not show a monoclonal spike.  He comes in today to go over all of his recent labs and the implications.  Overall, the patient claims to be doing fairly well.  She denies having fatigue, blood loss, or other systemic symptoms which concern her for an underlying hematologic disorder being present.  PHYSICAL EXAM:  There were no vitals taken for this visit. Wt Readings from Last 3 Encounters:  08/14/23 (!) 378 lb (171.5 kg)  07/31/23 (!) 377 lb 11.2 oz (171.3 kg)  02/26/19 285 lb (129.3 kg)   There is no height or weight on file to calculate BMI. Performance status (ECOG): 1 - Symptomatic but completely ambulatory Physical Exam Constitutional:      Appearance: Normal appearance. She is obese. She is not ill-appearing.     Comments: She is ambulating with a walker  HENT:     Mouth/Throat:     Mouth: Mucous membranes are moist.     Pharynx: Oropharynx is clear. No oropharyngeal exudate or posterior oropharyngeal erythema.  Cardiovascular:     Rate and Rhythm: Normal rate and regular rhythm.     Heart sounds: No murmur heard.    No friction rub. No gallop.  Pulmonary:     Effort: Pulmonary effort is normal. No respiratory distress.     Breath sounds: Normal breath sounds. No wheezing, rhonchi or rales.  Abdominal:     General: Bowel sounds are normal. There is no distension.     Palpations: Abdomen is soft. There is no mass.     Tenderness: There is no abdominal tenderness.  Musculoskeletal:        General: No swelling.     Right lower leg: No edema.     Left lower leg: No edema.  Lymphadenopathy:     Cervical: No cervical adenopathy.     Upper Body:     Right  upper body: No supraclavicular or axillary adenopathy.     Left upper body: No supraclavicular or axillary adenopathy.     Lower Body: No right inguinal adenopathy. No left inguinal adenopathy.  Skin:    General: Skin is warm.     Coloration: Skin is not jaundiced.     Findings: No lesion or rash.  Neurological:     General: No focal deficit present.     Mental Status: She is alert and oriented to person, place, and time. Mental status is at baseline.  Psychiatric:        Mood and Affect: Mood normal.        Behavior: Behavior normal.        Thought Content: Thought content normal.     LABS:      Latest Ref Rng & Units 07/31/2023   11:26 AM 02/26/2019    9:06 PM  CBC  WBC 4.0 - 10.5 K/uL 8.5  6.6   Hemoglobin 12.0 - 15.0 g/dL 87.5  88.9   Hematocrit 36.0 - 46.0 % 37.2  35.1   Platelets 150 - 400 K/uL 468  441       Latest Ref Rng & Units 07/31/2023   11:26 AM 02/27/2019    4:05 AM 02/26/2019  9:06 PM  CMP  Glucose 70 - 99 mg/dL 90   898   BUN 6 - 20 mg/dL 35   25   Creatinine 9.55 - 1.00 mg/dL 8.52   8.82   Sodium 864 - 145 mmol/L 139   139   Potassium 3.5 - 5.1 mmol/L 4.3   3.8   Chloride 98 - 111 mmol/L 102   105   CO2 22 - 32 mmol/L 24   22   Calcium 8.9 - 10.3 mg/dL 9.7   9.1   Total Protein 6.5 - 8.1 g/dL 8.8  8.1    Total Bilirubin 0.0 - 1.2 mg/dL 0.4  0.7    Alkaline Phos 38 - 126 U/L 94  86    AST 15 - 41 U/L 29  20    ALT 0 - 44 U/L 23  13      Latest Reference Range & Units 07/31/23 11:26  MCV 80.0 - 100.0 fL 78.5 (L)  (L): Data is abnormally low  Latest Reference Range & Units 07/31/23 11:26 07/31/23 11:27  Iron 28 - 170 ug/dL  57  UIBC ug/dL  657  TIBC 749 - 549 ug/dL  600  Saturation Ratios 10.4 - 31.8 %  14  Ferritin 11 - 307 ng/mL 153   Folate >5.9 ng/mL 6.0   Copper  80 - 158 ug/dL 99   Vitamin D , 25-Hydroxy 30 - 100 ng/mL 11.75 (L)   Vitamin B12 180 - 914 pg/mL 365   (L): Data is abnormally low  ASSESSMENT & PLAN:  Assessment/Plan:  A 58  y.o. female with a history of microcytic anemia and an elevated protein level.  In clinic today, I reviewed all of her recent labs with her.  Despite having a low MCV, the patient's iron parameters are all normal.  Furthermore, her hemoglobin of 12.4 is normal.  Based upon these labs, I do not believe any form of iron intervention is necessary at this time.  However, her iron parameters and CBC will continue to be followed over time.  Despite having an elevated total protein, her serum protein electrophoresis fortunately not reveal an M-spike to suggest an underlying plasma cell dyscrasia, such as multiple myeloma, is present.  As it stands currently, I do not see any serious hematologic issues which should impact this patient's life in the foreseeable future.  I will see her back in 6 months, primarily to ensure iron deficiency anemia has not gradually developed over time.  The patient understands all the plans discussed today and is in agreement with them.    Brodric Schauer DELENA Kerns, MD

## 2024-02-12 ENCOUNTER — Inpatient Hospital Stay: Payer: 59

## 2024-02-12 ENCOUNTER — Inpatient Hospital Stay: Payer: 59 | Attending: Oncology | Admitting: Oncology

## 2024-03-03 NOTE — Progress Notes (Deleted)
 [facility]  [address] [place],  [place]  [address] [phone]  Clinic Day:  03/03/2024  Referring physician: Marelyn Axon, MD   HISTORY OF PRESENT ILLNESS:  The patient is a 58 y.o. female who our office recently saw for microcytic anemia and an SPEP that was abnormal, but did not show a monoclonal spike.  He comes in today to go over all of his recent labs and the implications.  Overall, the patient claims to be doing fairly well.  She denies having fatigue, blood loss, or other systemic symptoms which concern her for an underlying hematologic disorder being present.  PHYSICAL EXAM:  There were no vitals taken for this visit. Wt Readings from Last 3 Encounters:  08/14/23 (!) 378 lb (171.5 kg)  07/31/23 (!) 377 lb 11.2 oz (171.3 kg)  02/26/19 285 lb (129.3 kg)   There is no height or weight on file to calculate BMI. Performance status (ECOG): 1 - Symptomatic but completely ambulatory Physical Exam Constitutional:      Appearance: Normal appearance. She is obese. She is not ill-appearing.     Comments: She is ambulating with a walker  HENT:     Mouth/Throat:     Mouth: Mucous membranes are moist.     Pharynx: Oropharynx is clear. No oropharyngeal exudate or posterior oropharyngeal erythema.  Cardiovascular:     Rate and Rhythm: Normal rate and regular rhythm.     Heart sounds: No murmur heard.    No friction rub. No gallop.  Pulmonary:     Effort: Pulmonary effort is normal. No respiratory distress.     Breath sounds: Normal breath sounds. No wheezing, rhonchi or rales.  Abdominal:     General: Bowel sounds are normal. There is no distension.     Palpations: Abdomen is soft. There is no mass.     Tenderness: There is no abdominal tenderness.  Musculoskeletal:        General: No swelling.     Right lower leg: No edema.     Left lower leg: No edema.  Lymphadenopathy:     Cervical: No cervical adenopathy.     Upper Body:     Right  upper body: No supraclavicular or axillary adenopathy.     Left upper body: No supraclavicular or axillary adenopathy.     Lower Body: No right inguinal adenopathy. No left inguinal adenopathy.  Skin:    General: Skin is warm.     Coloration: Skin is not jaundiced.     Findings: No lesion or rash.  Neurological:     General: No focal deficit present.     Mental Status: She is alert and oriented to person, place, and time. Mental status is at baseline.  Psychiatric:        Mood and Affect: Mood normal.        Behavior: Behavior normal.        Thought Content: Thought content normal.     LABS:      Latest Ref Rng & Units 07/31/2023   11:26 AM 02/26/2019    9:06 PM  CBC  WBC 4.0 - 10.5 K/uL 8.5  6.6   Hemoglobin 12.0 - 15.0 g/dL 87.5  88.9   Hematocrit 36.0 - 46.0 % 37.2  35.1   Platelets 150 - 400 K/uL 468  441       Latest Ref Rng & Units 07/31/2023   11:26 AM 02/27/2019    4:05 AM 02/26/2019  9:06 PM  CMP  Glucose 70 - 99 mg/dL 90   898   BUN 6 - 20 mg/dL 35   25   Creatinine 9.55 - 1.00 mg/dL 8.52   8.82   Sodium 864 - 145 mmol/L 139   139   Potassium 3.5 - 5.1 mmol/L 4.3   3.8   Chloride 98 - 111 mmol/L 102   105   CO2 22 - 32 mmol/L 24   22   Calcium 8.9 - 10.3 mg/dL 9.7   9.1   Total Protein 6.5 - 8.1 g/dL 8.8  8.1    Total Bilirubin 0.0 - 1.2 mg/dL 0.4  0.7    Alkaline Phos 38 - 126 U/L 94  86    AST 15 - 41 U/L 29  20    ALT 0 - 44 U/L 23  13      Latest Reference Range & Units 07/31/23 11:26  MCV 80.0 - 100.0 fL 78.5 (L)  (L): Data is abnormally low  Latest Reference Range & Units 07/31/23 11:26 07/31/23 11:27  Iron 28 - 170 ug/dL  57  UIBC ug/dL  657  TIBC 749 - 549 ug/dL  600  Saturation Ratios 10.4 - 31.8 %  14  Ferritin 11 - 307 ng/mL 153   Folate >5.9 ng/mL 6.0   Copper  80 - 158 ug/dL 99   Vitamin D , 25-Hydroxy 30 - 100 ng/mL 11.75 (L)   Vitamin B12 180 - 914 pg/mL 365   (L): Data is abnormally low  ASSESSMENT & PLAN:  Assessment/Plan:  A 58  y.o. female with a history of microcytic anemia and an elevated protein level.  In clinic today, I reviewed all of her recent labs with her.  Despite having a low MCV, the patient's iron parameters are all normal.  Furthermore, her hemoglobin of 12.4 is normal.  Based upon these labs, I do not believe any form of iron intervention is necessary at this time.  However, her iron parameters and CBC will continue to be followed over time.  Despite having an elevated total protein, her serum protein electrophoresis fortunately not reveal an M-spike to suggest an underlying plasma cell dyscrasia, such as multiple myeloma, is present.  As it stands currently, I do not see any serious hematologic issues which should impact this patient's life in the foreseeable future.  I will see her back in 6 months, primarily to ensure iron deficiency anemia has not gradually developed over time.  The patient understands all the plans discussed today and is in agreement with them.    Kylina Vultaggio DELENA Kerns, MD

## 2024-03-04 ENCOUNTER — Inpatient Hospital Stay: Admitting: Oncology

## 2024-03-04 ENCOUNTER — Inpatient Hospital Stay

## 2024-03-24 NOTE — Progress Notes (Deleted)
 [facility]  [address] [place],  [place]  [address] [phone]  Clinic Day:  03/24/2024  Referring physician: Marelyn Axon, MD   HISTORY OF PRESENT ILLNESS:  The patient is a 58 y.o. female who our office recently saw for microcytic anemia and an SPEP that was abnormal, but did not show a monoclonal spike.  He comes in today to go over all of his recent labs and the implications.  Overall, the patient claims to be doing fairly well.  She denies having fatigue, blood loss, or other systemic symptoms which concern her for an underlying hematologic disorder being present.  PHYSICAL EXAM:  There were no vitals taken for this visit. Wt Readings from Last 3 Encounters:  08/14/23 (!) 378 lb (171.5 kg)  07/31/23 (!) 377 lb 11.2 oz (171.3 kg)  02/26/19 285 lb (129.3 kg)   There is no height or weight on file to calculate BMI. Performance status (ECOG): 1 - Symptomatic but completely ambulatory Physical Exam Constitutional:      Appearance: Normal appearance. She is obese. She is not ill-appearing.     Comments: She is ambulating with a walker  HENT:     Mouth/Throat:     Mouth: Mucous membranes are moist.     Pharynx: Oropharynx is clear. No oropharyngeal exudate or posterior oropharyngeal erythema.  Cardiovascular:     Rate and Rhythm: Normal rate and regular rhythm.     Heart sounds: No murmur heard.    No friction rub. No gallop.  Pulmonary:     Effort: Pulmonary effort is normal. No respiratory distress.     Breath sounds: Normal breath sounds. No wheezing, rhonchi or rales.  Abdominal:     General: Bowel sounds are normal. There is no distension.     Palpations: Abdomen is soft. There is no mass.     Tenderness: There is no abdominal tenderness.  Musculoskeletal:        General: No swelling.     Right lower leg: No edema.     Left lower leg: No edema.  Lymphadenopathy:     Cervical: No cervical adenopathy.     Upper Body:     Right  upper body: No supraclavicular or axillary adenopathy.     Left upper body: No supraclavicular or axillary adenopathy.     Lower Body: No right inguinal adenopathy. No left inguinal adenopathy.  Skin:    General: Skin is warm.     Coloration: Skin is not jaundiced.     Findings: No lesion or rash.  Neurological:     General: No focal deficit present.     Mental Status: She is alert and oriented to person, place, and time. Mental status is at baseline.  Psychiatric:        Mood and Affect: Mood normal.        Behavior: Behavior normal.        Thought Content: Thought content normal.     LABS:      Latest Ref Rng & Units 07/31/2023   11:26 AM 02/26/2019    9:06 PM  CBC  WBC 4.0 - 10.5 K/uL 8.5  6.6   Hemoglobin 12.0 - 15.0 g/dL 87.5  88.9   Hematocrit 36.0 - 46.0 % 37.2  35.1   Platelets 150 - 400 K/uL 468  441       Latest Ref Rng & Units 07/31/2023   11:26 AM 02/27/2019    4:05 AM 02/26/2019  9:06 PM  CMP  Glucose 70 - 99 mg/dL 90   898   BUN 6 - 20 mg/dL 35   25   Creatinine 9.55 - 1.00 mg/dL 8.52   8.82   Sodium 864 - 145 mmol/L 139   139   Potassium 3.5 - 5.1 mmol/L 4.3   3.8   Chloride 98 - 111 mmol/L 102   105   CO2 22 - 32 mmol/L 24   22   Calcium 8.9 - 10.3 mg/dL 9.7   9.1   Total Protein 6.5 - 8.1 g/dL 8.8  8.1    Total Bilirubin 0.0 - 1.2 mg/dL 0.4  0.7    Alkaline Phos 38 - 126 U/L 94  86    AST 15 - 41 U/L 29  20    ALT 0 - 44 U/L 23  13      Latest Reference Range & Units 07/31/23 11:26  MCV 80.0 - 100.0 fL 78.5 (L)  (L): Data is abnormally low  Latest Reference Range & Units 07/31/23 11:26 07/31/23 11:27  Iron 28 - 170 ug/dL  57  UIBC ug/dL  657  TIBC 749 - 549 ug/dL  600  Saturation Ratios 10.4 - 31.8 %  14  Ferritin 11 - 307 ng/mL 153   Folate >5.9 ng/mL 6.0   Copper  80 - 158 ug/dL 99   Vitamin D , 25-Hydroxy 30 - 100 ng/mL 11.75 (L)   Vitamin B12 180 - 914 pg/mL 365   (L): Data is abnormally low  ASSESSMENT & PLAN:  Assessment/Plan:  A 58  y.o. female with a history of microcytic anemia and an elevated protein level.  In clinic today, I reviewed all of her recent labs with her.  Despite having a low MCV, the patient's iron parameters are all normal.  Furthermore, her hemoglobin of 12.4 is normal.  Based upon these labs, I do not believe any form of iron intervention is necessary at this time.  However, her iron parameters and CBC will continue to be followed over time.  Despite having an elevated total protein, her serum protein electrophoresis fortunately not reveal an M-spike to suggest an underlying plasma cell dyscrasia, such as multiple myeloma, is present.  As it stands currently, I do not see any serious hematologic issues which should impact this patient's life in the foreseeable future.  I will see her back in 6 months, primarily to ensure iron deficiency anemia has not gradually developed over time.  The patient understands all the plans discussed today and is in agreement with them.    Jenyfer Trawick DELENA Kerns, MD

## 2024-03-25 ENCOUNTER — Inpatient Hospital Stay: Admitting: Oncology

## 2024-03-25 ENCOUNTER — Inpatient Hospital Stay

## 2024-04-15 ENCOUNTER — Other Ambulatory Visit: Payer: Self-pay

## 2024-04-15 ENCOUNTER — Inpatient Hospital Stay (HOSPITAL_BASED_OUTPATIENT_CLINIC_OR_DEPARTMENT_OTHER): Admitting: Hematology and Oncology

## 2024-04-15 ENCOUNTER — Inpatient Hospital Stay: Admitting: Oncology

## 2024-04-15 ENCOUNTER — Telehealth: Payer: Self-pay | Admitting: Hematology and Oncology

## 2024-04-15 ENCOUNTER — Inpatient Hospital Stay: Attending: Oncology

## 2024-04-15 VITALS — BP 113/63 | HR 78 | Temp 97.8°F | Resp 18 | Ht 62.0 in | Wt 367.6 lb

## 2024-04-15 DIAGNOSIS — D509 Iron deficiency anemia, unspecified: Secondary | ICD-10-CM | POA: Insufficient documentation

## 2024-04-15 DIAGNOSIS — R768 Other specified abnormal immunological findings in serum: Secondary | ICD-10-CM | POA: Insufficient documentation

## 2024-04-15 LAB — CBC WITH DIFFERENTIAL (CANCER CENTER ONLY)
Abs Immature Granulocytes: 0.03 K/uL (ref 0.00–0.07)
Basophils Absolute: 0 K/uL (ref 0.0–0.1)
Basophils Relative: 0 %
Eosinophils Absolute: 0.3 K/uL (ref 0.0–0.5)
Eosinophils Relative: 4 %
HCT: 36.7 % (ref 36.0–46.0)
Hemoglobin: 12.1 g/dL (ref 12.0–15.0)
Immature Granulocytes: 0 %
Lymphocytes Relative: 34 %
Lymphs Abs: 2.3 K/uL (ref 0.7–4.0)
MCH: 25.9 pg — ABNORMAL LOW (ref 26.0–34.0)
MCHC: 33 g/dL (ref 30.0–36.0)
MCV: 78.6 fL — ABNORMAL LOW (ref 80.0–100.0)
Monocytes Absolute: 0.5 K/uL (ref 0.1–1.0)
Monocytes Relative: 7 %
Neutro Abs: 3.7 K/uL (ref 1.7–7.7)
Neutrophils Relative %: 55 %
Platelet Count: 441 K/uL — ABNORMAL HIGH (ref 150–400)
RBC: 4.67 MIL/uL (ref 3.87–5.11)
RDW: 16.3 % — ABNORMAL HIGH (ref 11.5–15.5)
WBC Count: 6.8 K/uL (ref 4.0–10.5)
nRBC: 0 % (ref 0.0–0.2)

## 2024-04-15 LAB — CMP (CANCER CENTER ONLY)
ALT: 11 U/L (ref 0–44)
AST: 18 U/L (ref 15–41)
Albumin: 4.1 g/dL (ref 3.5–5.0)
Alkaline Phosphatase: 99 U/L (ref 38–126)
Anion gap: 16 — ABNORMAL HIGH (ref 5–15)
BUN: 15 mg/dL (ref 6–20)
CO2: 24 mmol/L (ref 22–32)
Calcium: 9.5 mg/dL (ref 8.9–10.3)
Chloride: 102 mmol/L (ref 98–111)
Creatinine: 1.08 mg/dL — ABNORMAL HIGH (ref 0.44–1.00)
GFR, Estimated: 59 mL/min — ABNORMAL LOW (ref >60)
Glucose, Bld: 111 mg/dL — ABNORMAL HIGH (ref 70–99)
Potassium: 3.4 mmol/L — ABNORMAL LOW (ref 3.5–5.1)
Sodium: 141 mmol/L (ref 135–145)
Total Bilirubin: 0.3 mg/dL (ref 0.0–1.2)
Total Protein: 8.5 g/dL — ABNORMAL HIGH (ref 6.5–8.1)

## 2024-04-15 LAB — IRON AND TIBC
Iron: 47 ug/dL (ref 28–170)
Saturation Ratios: 16 % (ref 10.4–31.8)
TIBC: 286 ug/dL (ref 250–450)
UIBC: 239 ug/dL

## 2024-04-15 LAB — FERRITIN: Ferritin: 174 ng/mL (ref 11–307)

## 2024-04-15 NOTE — Telephone Encounter (Signed)
 Patient has been scheduled for follow-up visit per 04/15/24 LOS.  Pt given an appt calendar with date and time.

## 2024-04-15 NOTE — Progress Notes (Signed)
 [facility]  [address] [place],  [place]  [address] [phone]  Clinic Day:  04/15/2024  Referring physician: Marelyn Axon, MD   HISTORY OF PRESENT ILLNESS:  The patient is a 58 y.o. female who our office recently saw for microcytic anemia and an SPEP that was abnormal, but did not show a monoclonal spike.  She comes in today for repeat evaluation.  Overall, the patient claims to be doing fairly well.  She continues to have some fatigue, but denies blood loss, or other systemic symptoms which concern her for an underlying hematologic disorder being present.  PHYSICAL EXAM:  Blood pressure 113/63, pulse 78, temperature 97.8 F (36.6 C), temperature source Oral, resp. rate 18, height 5' 2 (1.575 m), weight (!) 367 lb 9.6 oz (166.7 kg), SpO2 98%. Wt Readings from Last 3 Encounters:  04/15/24 (!) 367 lb 9.6 oz (166.7 kg)  08/14/23 (!) 378 lb (171.5 kg)  07/31/23 (!) 377 lb 11.2 oz (171.3 kg)   Body mass index is 67.23 kg/m. Performance status (ECOG): 1 - Symptomatic but completely ambulatory Physical Exam Constitutional:      Appearance: Normal appearance. She is obese. She is not ill-appearing.     Comments: She is ambulating with a walker  HENT:     Mouth/Throat:     Mouth: Mucous membranes are moist.     Pharynx: Oropharynx is clear. No oropharyngeal exudate or posterior oropharyngeal erythema.  Cardiovascular:     Rate and Rhythm: Normal rate and regular rhythm.     Heart sounds: No murmur heard.    No friction rub. No gallop.  Pulmonary:     Effort: Pulmonary effort is normal. No respiratory distress.     Breath sounds: Normal breath sounds. No wheezing, rhonchi or rales.  Abdominal:     General: Bowel sounds are normal. There is no distension.     Palpations: Abdomen is soft. There is no mass.     Tenderness: There is no abdominal tenderness.  Musculoskeletal:        General: No swelling.     Right lower leg: No edema.     Left  lower leg: No edema.  Lymphadenopathy:     Cervical: No cervical adenopathy.     Upper Body:     Right upper body: No supraclavicular or axillary adenopathy.     Left upper body: No supraclavicular or axillary adenopathy.     Lower Body: No right inguinal adenopathy. No left inguinal adenopathy.  Skin:    General: Skin is warm.     Coloration: Skin is not jaundiced.     Findings: No lesion or rash.  Neurological:     General: No focal deficit present.     Mental Status: She is alert and oriented to person, place, and time. Mental status is at baseline.  Psychiatric:        Mood and Affect: Mood normal.        Behavior: Behavior normal.        Thought Content: Thought content normal.     LABS:      Latest Ref Rng & Units 04/15/2024    8:47 AM 07/31/2023   11:26 AM 02/26/2019    9:06 PM  CBC  WBC 4.0 - 10.5 K/uL 6.8  8.5  6.6   Hemoglobin 12.0 - 15.0 g/dL 87.8  87.5  88.9   Hematocrit 36.0 - 46.0 % 36.7  37.2  35.1   Platelets 150 - 400  K/uL 441  468  441       Latest Ref Rng & Units 04/15/2024    8:47 AM 07/31/2023   11:26 AM 02/27/2019    4:05 AM  CMP  Glucose 70 - 99 mg/dL 888  90    BUN 6 - 20 mg/dL 15  35    Creatinine 9.55 - 1.00 mg/dL 8.91  8.52    Sodium 864 - 145 mmol/L 141  139    Potassium 3.5 - 5.1 mmol/L 3.4  4.3    Chloride 98 - 111 mmol/L 102  102    CO2 22 - 32 mmol/L 24  24    Calcium 8.9 - 10.3 mg/dL 9.5  9.7    Total Protein 6.5 - 8.1 g/dL 8.5  8.8  8.1   Total Bilirubin 0.0 - 1.2 mg/dL 0.3  0.4  0.7   Alkaline Phos 38 - 126 U/L 99  94  86   AST 15 - 41 U/L 18  29  20    ALT 0 - 44 U/L 11  23  13      Latest Reference Range & Units 07/31/23 11:26  MCV 80.0 - 100.0 fL 78.5 (L)  (L): Data is abnormally low  Latest Reference Range & Units 07/31/23 11:26 07/31/23 11:27  Iron 28 - 170 ug/dL  57  UIBC ug/dL  657  TIBC 749 - 549 ug/dL  600  Saturation Ratios 10.4 - 31.8 %  14  Ferritin 11 - 307 ng/mL 153   Folate >5.9 ng/mL 6.0   Copper  80 - 158  ug/dL 99   Vitamin D , 25-Hydroxy 30 - 100 ng/mL 11.75 (L)   Vitamin B12 180 - 914 pg/mL 365   (L): Data is abnormally low  ASSESSMENT & PLAN:  Assessment/Plan:  A 58 y.o. female with a history of microcytic anemia and an elevated protein level.  In clinic today, I reviewed all of her recent labs with her. Hemoglobin today is 12.1 with pending iron studies. Despite having an elevated total protein, her serum protein electrophoresis fortunately does not reveal an M-spike to suggest an underlying plasma cell dyscrasia, such as multiple myeloma, is present.  As it stands currently, I do not see any serious hematologic issues which should impact this patient's life in the foreseeable future.  I will see her back in 6 months, primarily to ensure iron deficiency anemia has not gradually developed over time.  The patient understands all the plans discussed today and is in agreement with them.    Eleanor DELENA Bach, NP

## 2024-10-14 ENCOUNTER — Other Ambulatory Visit

## 2024-10-14 ENCOUNTER — Ambulatory Visit: Admitting: Oncology
